# Patient Record
Sex: Female | Born: 1945 | Race: White | Hispanic: No | Smoking: Current every day smoker
Health system: Southern US, Community
[De-identification: ages and names within clinical notes are randomized; demographics above are authoritative.]

## PROBLEM LIST (undated history)

## (undated) DIAGNOSIS — J45909 Unspecified asthma, uncomplicated: Secondary | ICD-10-CM

## (undated) HISTORY — DX: Unspecified asthma, uncomplicated: J45.909

---

## 2011-01-18 ENCOUNTER — Emergency Department (HOSPITAL_COMMUNITY): Payer: No Typology Code available for payment source

## 2011-01-18 ENCOUNTER — Inpatient Hospital Stay (HOSPITAL_COMMUNITY)
Admission: EM | Admit: 2011-01-18 | Discharge: 2011-01-23 | DRG: 087 | Discharge status: Against Medical Advice | Payer: No Typology Code available for payment source | Attending: Emergency Medicine | Admitting: Emergency Medicine

## 2011-01-18 DIAGNOSIS — R4182 Altered mental status, unspecified: Secondary | ICD-10-CM | POA: Diagnosis present

## 2011-01-18 DIAGNOSIS — F1010 Alcohol abuse, uncomplicated: Secondary | ICD-10-CM | POA: Diagnosis present

## 2011-01-18 DIAGNOSIS — S065X0A Traumatic subdural hemorrhage without loss of consciousness, initial encounter: Principal | ICD-10-CM | POA: Diagnosis present

## 2011-01-18 DIAGNOSIS — S0280XA Fracture of other specified skull and facial bones, unspecified side, initial encounter for closed fracture: Secondary | ICD-10-CM | POA: Diagnosis present

## 2011-01-18 DIAGNOSIS — Y998 Other external cause status: Secondary | ICD-10-CM

## 2011-01-18 DIAGNOSIS — S139XXA Sprain of joints and ligaments of unspecified parts of neck, initial encounter: Secondary | ICD-10-CM | POA: Diagnosis present

## 2011-01-18 DIAGNOSIS — F17200 Nicotine dependence, unspecified, uncomplicated: Secondary | ICD-10-CM | POA: Diagnosis present

## 2011-01-18 DIAGNOSIS — H0520 Unspecified exophthalmos: Secondary | ICD-10-CM | POA: Diagnosis present

## 2011-01-18 DIAGNOSIS — S0100XA Unspecified open wound of scalp, initial encounter: Secondary | ICD-10-CM | POA: Diagnosis present

## 2011-01-18 DIAGNOSIS — S0003XA Contusion of scalp, initial encounter: Secondary | ICD-10-CM | POA: Diagnosis present

## 2011-01-18 DIAGNOSIS — Z781 Physical restraint status: Secondary | ICD-10-CM | POA: Diagnosis present

## 2011-01-18 DIAGNOSIS — G9389 Other specified disorders of brain: Secondary | ICD-10-CM | POA: Diagnosis present

## 2011-01-18 LAB — COMPREHENSIVE METABOLIC PANEL
Albumin: 3.7 g/dL (ref 3.5–5.2)
Alkaline Phosphatase: 87 U/L (ref 39–117)
BUN: 16 mg/dL (ref 6–23)
Creatinine, Ser: 0.82 mg/dL (ref 0.4–1.2)
Glucose, Bld: 99 mg/dL (ref 70–99)
Potassium: 3.8 mEq/L (ref 3.5–5.1)
Total Bilirubin: 0.3 mg/dL (ref 0.3–1.2)
Total Protein: 6.4 g/dL (ref 6.0–8.3)

## 2011-01-18 LAB — POCT I-STAT 3, ART BLOOD GAS (G3+)
pCO2 arterial: 38.8 mmHg (ref 35.0–45.0)
pH, Arterial: 7.397 (ref 7.350–7.400)
pO2, Arterial: 107 mmHg — ABNORMAL HIGH (ref 80.0–100.0)

## 2011-01-18 LAB — LACTIC ACID, PLASMA: Lactic Acid, Venous: 2.8 mmol/L — ABNORMAL HIGH (ref 0.5–2.2)

## 2011-01-18 LAB — POCT I-STAT, CHEM 8
Calcium, Ion: 0.98 mmol/L — ABNORMAL LOW (ref 1.12–1.32)
Glucose, Bld: 101 mg/dL — ABNORMAL HIGH (ref 70–99)
HCT: 41 % (ref 36.0–46.0)
Hemoglobin: 13.9 g/dL (ref 12.0–15.0)
TCO2: 24 mmol/L (ref 0–100)

## 2011-01-18 LAB — CBC
HCT: 39 % (ref 36.0–46.0)
MCHC: 34.1 g/dL (ref 30.0–36.0)
Platelets: 236 10*3/uL (ref 150–400)
RDW: 12.2 % (ref 11.5–15.5)
WBC: 10.1 10*3/uL (ref 4.0–10.5)

## 2011-01-18 LAB — URINE MICROSCOPIC-ADD ON

## 2011-01-18 LAB — URINALYSIS, ROUTINE W REFLEX MICROSCOPIC
Bilirubin Urine: NEGATIVE
Glucose, UA: NEGATIVE mg/dL
Ketones, ur: NEGATIVE mg/dL
Protein, ur: NEGATIVE mg/dL
pH: 6 (ref 5.0–8.0)

## 2011-01-18 LAB — TYPE AND SCREEN
ABO/RH(D): A POS
Antibody Screen: NEGATIVE

## 2011-01-18 LAB — PROTIME-INR: INR: 0.87 (ref 0.00–1.49)

## 2011-01-18 MED ORDER — IOHEXOL 300 MG/ML  SOLN
100.0000 mL | Freq: Once | INTRAMUSCULAR | Status: AC | PRN
  Administered 2011-01-18: 100 mL via INTRAVENOUS

## 2011-01-19 ENCOUNTER — Inpatient Hospital Stay (HOSPITAL_COMMUNITY): Payer: No Typology Code available for payment source

## 2011-01-19 LAB — CBC
MCH: 29.8 pg (ref 26.0–34.0)
MCHC: 32.8 g/dL (ref 30.0–36.0)
WBC: 10.7 10*3/uL — ABNORMAL HIGH (ref 4.0–10.5)

## 2011-01-19 LAB — BASIC METABOLIC PANEL
CO2: 27 mEq/L (ref 19–32)
Calcium: 8.2 mg/dL — ABNORMAL LOW (ref 8.4–10.5)
Chloride: 107 mEq/L (ref 96–112)
Creatinine, Ser: 0.73 mg/dL (ref 0.4–1.2)
Glucose, Bld: 102 mg/dL — ABNORMAL HIGH (ref 70–99)

## 2011-01-19 LAB — GLUCOSE, CAPILLARY: Glucose-Capillary: 84 mg/dL (ref 70–99)

## 2011-01-19 LAB — MRSA PCR SCREENING: MRSA by PCR: NEGATIVE

## 2011-01-20 ENCOUNTER — Inpatient Hospital Stay (HOSPITAL_COMMUNITY): Payer: No Typology Code available for payment source

## 2011-01-20 LAB — BASIC METABOLIC PANEL
Calcium: 8.5 mg/dL (ref 8.4–10.5)
Chloride: 110 mEq/L (ref 96–112)
Creatinine, Ser: 0.68 mg/dL (ref 0.4–1.2)
GFR calc Af Amer: >60 mL/min (ref >60)
GFR calc non Af Amer: >60 mL/min (ref >60)

## 2011-01-20 LAB — CBC
MCH: 30.4 pg (ref 26.0–34.0)
MCHC: 33.2 g/dL (ref 30.0–36.0)
Platelets: 170 10*3/uL (ref 150–400)
RBC: 3.88 MIL/uL (ref 3.87–5.11)
RDW: 12.5 % (ref 11.5–15.5)
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
## 2015-06-17 DIAGNOSIS — J449 Chronic obstructive pulmonary disease, unspecified: Secondary | ICD-10-CM | POA: Diagnosis not present

## 2015-06-17 DIAGNOSIS — J4540 Moderate persistent asthma, uncomplicated: Secondary | ICD-10-CM | POA: Diagnosis not present

## 2015-06-17 DIAGNOSIS — J309 Allergic rhinitis, unspecified: Secondary | ICD-10-CM | POA: Diagnosis not present

## 2015-06-21 DIAGNOSIS — Z872 Personal history of diseases of the skin and subcutaneous tissue: Secondary | ICD-10-CM | POA: Insufficient documentation

## 2015-06-21 DIAGNOSIS — Z889 Allergy status to unspecified drugs, medicaments and biological substances status: Secondary | ICD-10-CM | POA: Insufficient documentation

## 2015-06-21 DIAGNOSIS — J309 Allergic rhinitis, unspecified: Secondary | ICD-10-CM | POA: Insufficient documentation

## 2015-06-21 DIAGNOSIS — J45909 Unspecified asthma, uncomplicated: Secondary | ICD-10-CM | POA: Insufficient documentation

## 2015-06-21 DIAGNOSIS — Z7722 Contact with and (suspected) exposure to environmental tobacco smoke (acute) (chronic): Secondary | ICD-10-CM | POA: Insufficient documentation

## 2015-08-20 DIAGNOSIS — O24414 Gestational diabetes mellitus in pregnancy, insulin controlled: Secondary | ICD-10-CM | POA: Diagnosis not present

## 2015-08-20 DIAGNOSIS — Z720 Tobacco use: Secondary | ICD-10-CM | POA: Diagnosis not present

## 2015-08-20 DIAGNOSIS — Z0000 Encounter for general adult medical examination without abnormal findings: Secondary | ICD-10-CM | POA: Diagnosis not present

## 2015-09-23 ENCOUNTER — Ambulatory Visit (INDEPENDENT_AMBULATORY_CARE_PROVIDER_SITE_OTHER): Payer: Medicare Other | Admitting: Allergy and Immunology

## 2015-09-23 ENCOUNTER — Encounter: Payer: Self-pay | Admitting: Allergy and Immunology

## 2015-09-23 VITALS — BP 122/74 | HR 88 | Temp 97.7°F | Resp 16 | Ht 66.93 in | Wt 178.6 lb

## 2015-09-23 DIAGNOSIS — H1010 Acute atopic conjunctivitis, unspecified eye: Secondary | ICD-10-CM | POA: Diagnosis not present

## 2015-09-23 DIAGNOSIS — L501 Idiopathic urticaria: Secondary | ICD-10-CM | POA: Diagnosis not present

## 2015-09-23 DIAGNOSIS — J309 Allergic rhinitis, unspecified: Secondary | ICD-10-CM

## 2015-09-23 DIAGNOSIS — Z91018 Allergy to other foods: Secondary | ICD-10-CM | POA: Diagnosis not present

## 2015-09-23 DIAGNOSIS — J441 Chronic obstructive pulmonary disease with (acute) exacerbation: Secondary | ICD-10-CM | POA: Diagnosis not present

## 2015-09-23 DIAGNOSIS — J45901 Unspecified asthma with (acute) exacerbation: Secondary | ICD-10-CM | POA: Diagnosis not present

## 2015-09-23 DIAGNOSIS — Z720 Tobacco use: Secondary | ICD-10-CM | POA: Diagnosis not present

## 2015-09-23 MED ORDER — CETIRIZINE HCL 10 MG PO TABS: 10.0000 mg | ORAL_TABLET | Freq: Two times a day (BID) | ORAL | Status: DC

## 2015-09-23 MED ORDER — METHYLPREDNISOLONE ACETATE 80 MG/ML IJ SUSP
80.0000 mg | Freq: Once | INTRAMUSCULAR | Status: AC
  Administered 2015-09-23: 80 mg via INTRAMUSCULAR

## 2015-09-23 MED ORDER — IPRATROPIUM BROMIDE 0.02 % IN SOLN
0.5000 mg | Freq: Once | RESPIRATORY_TRACT | Status: AC
  Administered 2015-09-23: 0.5 mg via RESPIRATORY_TRACT

## 2015-09-23 MED ORDER — AZITHROMYCIN 500 MG PO TABS: 500.0000 mg | ORAL_TABLET | Freq: Every day | ORAL | Status: DC

## 2015-09-23 MED ORDER — ALBUTEROL SULFATE (2.5 MG/3ML) 0.083% IN NEBU
2.5000 mg | INHALATION_SOLUTION | Freq: Once | RESPIRATORY_TRACT | Status: AC
  Administered 2015-09-23: 2.5 mg via RESPIRATORY_TRACT

## 2015-09-23 NOTE — Progress Notes (Signed)
[facility]  Follow-up Note  Refering Provider: No ref. provider found Primary Provider: Abigail Miyamoto, MD  Subjective:   [name] is a 69 y.o. female who [facility] in re-evaluation of the following:  HPI Comments:  [name] returns to this clinic on 20 to December 2016 in reevaluation of her obstructive lung disease with component of asthma, recurrent urticaria and allergic reactions and possible food allergy, allergic rhinitis, and a history of rhinitis medicamentosa and tobacco abuse. She states that she is doing fine over the course of the past several months but unfortunately last week she developed wheezing and coughing and sputum production without much upper airway symptoms. She may of had a fever but she has no ugly sputum production and no ugly nasal production. She did develop hives on her face and chest and then globally across her body during this event that she is extremely itchy and is taking Benadryl. It sounds as though she is using her medications appropriately although she's not entirely sure exactly what she is using. It does sound as though she's taking 3 pills which we assume is cetirizine, montelukast, and famotidine area she's also using an inhaler which we assume is Breo 200. She's been using pro-air extensively   Current Outpatient Prescriptions on File Prior to Visit  Medication Sig Dispense Refill  . albuterol (PROAIR HFA) 108 (90 BASE) MCG/ACT inhaler Inhale 2 puffs into the lungs every 4 (four) hours as needed for wheezing or shortness of breath.    . DiphenhydrAMINE HCl (BENADRYL ALLERGY PO) Take by mouth as needed.    Marland Kitchen EPINEPHrine (EPIPEN 2-PAK) 0.3 mg/0.3 mL IJ SOAJ injection Inject 0.3 mg into the muscle once.     No current facility-administered medications on file prior to visit.    Meds ordered this encounter  Medications  . methylPREDNISolone acetate  (DEPO-MEDROL) injection 80 mg    Sig:   . albuterol (PROVENTIL) (2.5 MG/3ML) 0.083% nebulizer solution 2.5 mg    Sig:   . ipratropium (ATROVENT) nebulizer solution 0.5 mg    Sig:   . azithromycin (ZITHROMAX) 500 MG tablet    Sig: Take 1 tablet (500 mg total) by mouth daily.    Dispense:  3 tablet    Refill:  0  . cetirizine (ZYRTEC) 10 MG tablet    Sig: Take 1 tablet (10 mg total) by mouth 2 (two) times daily.    Dispense:  60 tablet    Refill:  3    Past Medical History  Diagnosis Date  . Asthma     History reviewed. No pertinent past surgical history.  Allergies  Allergen Reactions  . Penicillins     Review of Systems  Constitutional: Negative for fever, chills and fatigue.  HENT: Negative for congestion, ear pain, facial swelling, hearing loss, nosebleeds, postnasal drip, rhinorrhea, sinus pressure, sneezing, sore throat, tinnitus, trouble swallowing and voice change.   Eyes: Negative for pain, discharge, redness and itching.  Respiratory: Positive for cough, shortness of breath and wheezing. Negative for chest tightness.   Cardiovascular: Negative for chest pain and leg swelling.  Gastrointestinal: Negative for nausea, vomiting and abdominal pain.  Endocrine: Negative for cold intolerance and heat intolerance.  Musculoskeletal: Negative for myalgias and arthralgias.  Skin: Negative for rash.  Allergic/Immunologic: Negative.   Neurological: Negative for dizziness and headaches.  Hematological: Negative for adenopathy.     Objective:   Filed Vitals:  09/23/15 1006  BP: 122/74  Pulse: 88  Temp: 97.7 F (36.5 C)  Resp: 16   Height: 5' 6.93" (170 cm)  Weight: 178 lb 9.2 oz (81 kg)   Physical Exam  Constitutional: She appears well-developed and well-nourished. No distress.  Coughing  HENT:  Head: Normocephalic and atraumatic. Head is without right periorbital erythema and without left periorbital erythema.  Right Ear: Tympanic membrane, external ear and  ear canal normal. No drainage or tenderness. No foreign bodies. Tympanic membrane is not injected, not scarred, not perforated, not erythematous, not retracted and not bulging. No middle ear effusion.  Left Ear: Tympanic membrane, external ear and ear canal normal. No drainage or tenderness. No foreign bodies. Tympanic membrane is not injected, not scarred, not perforated, not erythematous, not retracted and not bulging.  No middle ear effusion.  Nose: Nose normal. No mucosal edema, rhinorrhea, nose lacerations or sinus tenderness.  No foreign bodies.  Mouth/Throat: Oropharynx is clear and moist. No oropharyngeal exudate, posterior oropharyngeal edema, posterior oropharyngeal erythema or tonsillar abscesses.  Eyes: Lids are normal. Right eye exhibits no chemosis, no discharge and no exudate. No foreign body present in the right eye. Left eye exhibits no chemosis, no discharge and no exudate. No foreign body present in the left eye. Right conjunctiva is not injected. Left conjunctiva is not injected.  Neck: Neck supple. No tracheal tenderness present. No tracheal deviation and no edema present. No thyroid mass and no thyromegaly present.  Cardiovascular: Normal rate, regular rhythm, S1 normal and S2 normal.  Exam reveals no gallop.   No murmur heard. Pulmonary/Chest: No accessory muscle usage or stridor. No respiratory distress. She has wheezes (bilateral expiratory wheezing). She has no rhonchi. She has no rales.  Abdominal: Soft.  Lymphadenopathy:       Head (right side): No tonsillar adenopathy present.       Head (left side): No tonsillar adenopathy present.    She has no cervical adenopathy.  Neurological: She is alert.  Skin: Rash (slight erythema anterior chest) noted. She is not diaphoretic.  Psychiatric: She has a normal mood and affect. Her behavior is normal.    Diagnostics:    Spirometry was performed and demonstrated an FEV1 of 2.04 at 79 % of predicted.  The patient had an Asthma  Control Test with the following results: ACT Total Score: 9.    Assessment and Plan:   1. Acute exacerbation of COPD with asthma (HCC)   2. Allergic rhinoconjunctivitis   3. Idiopathic urticaria   4. History of food allergy   5. Tobacco use      1.  Depo-Medrol 80 IM delivered in clinic  2. Duoneb delivered in clinic  3. Nasal saline several times per day  4. Increase cetirizine 10 mg to two times per day  5. Continue montelukast 10 mg one time per day  6. Continue famotidine 10 mg one tablet one time per day  7. Continue Breo 200 one inhalation 1 time per day  8. Continue OTC Rhinocort one spray each nostril one time per day  9. Continue ProAir HFA 2 puffs every 4-6 hours or albuterol nebulization if needed  10. Continue EpiPen if needed  11. Azithromycin 500 mg one tablet once a day for 3 days  12. Further evaluation?  13. Return to clinic in 3 months or earlier if problem   Jalayla is obviously had a exacerbation of her COPD and asthma and she'll use the therapy described above and we'll make the  assumption she will do well. Certainly she will return to this clinic should she develop a significant problem in the face of this therapy. I did give her broad-spectrum metabolic some systemic steroids in the hope of minimizing this exacerbation. She'll continue to use therapy directed against her upper airway disease and I've encouraged her once again to stop smoking. She does have an EpiPen to use should she ever develop significant reaction in the future.    Laurette Schimke, MD [facility]

## 2015-09-23 NOTE — Patient Instructions (Addendum)
  1.  Depo-Medrol 80 IM delivered in clinic  2. Duoneb delivered in clinic  3. Nasal saline several times per day  4. Increase cetirizine 10 mg to two times per day  5. Continue montelukast 10 mg one time per day  6. Continue famotidine 10 mg one tablet one time per day  7. Continue Breo 200 one inhalation 1 time per day  8. Continue OTC Rhinocort one spray each nostril one time per day  9. Continue ProAir HFA 2 puffs every 4-6 hours or albuterol nebulization if needed  10. Continue EpiPen if needed  11. Azithromycin 500 mg one tablet once a day for 3 days  12. Further evaluation?  13. Return to clinic in 3 months or earlier if problem

## 2015-10-20 DIAGNOSIS — E785 Hyperlipidemia, unspecified: Secondary | ICD-10-CM | POA: Diagnosis not present

## 2015-10-20 DIAGNOSIS — R7303 Prediabetes: Secondary | ICD-10-CM | POA: Diagnosis not present

## 2015-11-17 DIAGNOSIS — H2513 Age-related nuclear cataract, bilateral: Secondary | ICD-10-CM | POA: Diagnosis not present

## 2015-12-02 DIAGNOSIS — H353130 Nonexudative age-related macular degeneration, bilateral, stage unspecified: Secondary | ICD-10-CM | POA: Diagnosis not present

## 2015-12-23 ENCOUNTER — Encounter: Payer: Self-pay | Admitting: Allergy and Immunology

## 2015-12-23 ENCOUNTER — Ambulatory Visit (INDEPENDENT_AMBULATORY_CARE_PROVIDER_SITE_OTHER): Payer: Medicare Other | Admitting: Allergy and Immunology

## 2015-12-23 VITALS — BP 128/72 | HR 72 | Resp 16

## 2015-12-23 DIAGNOSIS — H1010 Acute atopic conjunctivitis, unspecified eye: Secondary | ICD-10-CM

## 2015-12-23 DIAGNOSIS — L501 Idiopathic urticaria: Secondary | ICD-10-CM

## 2015-12-23 DIAGNOSIS — J309 Allergic rhinitis, unspecified: Secondary | ICD-10-CM | POA: Diagnosis not present

## 2015-12-23 DIAGNOSIS — J449 Chronic obstructive pulmonary disease, unspecified: Secondary | ICD-10-CM | POA: Diagnosis not present

## 2015-12-23 DIAGNOSIS — Z91018 Allergy to other foods: Secondary | ICD-10-CM

## 2015-12-23 DIAGNOSIS — J45909 Unspecified asthma, uncomplicated: Secondary | ICD-10-CM | POA: Diagnosis not present

## 2015-12-23 DIAGNOSIS — Z720 Tobacco use: Secondary | ICD-10-CM

## 2015-12-23 NOTE — Patient Instructions (Signed)
    1. Cetirizine 10 mg one times per day  2. Continue montelukast 10 mg one time per day  3. Continue famotidine 10 mg one tablet one time per day  4. Continue OTC Rhinocort one spray each nostril one time per day  5. Continue ProAir HFA 2 puffs every 4-6 hours or albuterol nebulization if needed  6. Continue EpiPen if needed  7. Return to clinic in 6 months or earlier if problem

## 2015-12-23 NOTE — Progress Notes (Signed)
Follow-up Note  Referring Provider: Abigail Miyamoto,* Primary Provider: Abigail Miyamoto, MD Date of Office Visit: 12/23/2015  Subjective:   [name] (DOB: [birth year]) is a 70 y.o. female who [facility] on 12/23/2015 in re-evaluation of the following:  HPI Comments: [name] returns to this clinic in reevaluation of her obstructive lung disease with component of asthma, recurrent urticaria and allergic reactions, possible food allergy, allergic rhinitis, and tobacco use. I've not seen her in his clinic since 09/21/2015. She's had a very good interval. She's had no flareups of her lung issue and has not required any systemic steroids. She uses a bronchodilator 3 times per day. She stopped her Gracelyn Nurse because it irritated her mouth and made her lungs hurt. She is very satisfied with using her short acting bronchodilator 3 times per day and she thinks that this was working very well. She continues to smoke extensively. She is not had any issues with hives while utilizing her cetirizine and famotidine and montelukast. She's had no problems with her nose while intermittently using her Rhinocort.     Medication List       This list is accurate as of: 12/23/15 10:48 AM.  Always use your most recent med list.               BENADRYL ALLERGY PO  Take by mouth as needed.     BREO ELLIPTA 200-25 MCG/INH Aepb  Generic drug:  fluticasone furoate-vilanterol  Inhale 1 puff into the lungs daily. Reported on 12/23/2015     cetirizine 10 MG tablet  Commonly known as:  ZYRTEC  Take 1 tablet (10 mg total) by mouth 2 (two) times daily.     EPIPEN 2-PAK 0.3 mg/0.3 mL Soaj injection  Generic drug:  EPINEPHrine  Inject 0.3 mg into the muscle once.     famotidine 10 MG tablet  Commonly known as:  PEPCID  Take 10 mg by mouth daily.     montelukast 10 MG tablet  Commonly known as:  SINGULAIR  Take 10 mg by mouth daily.     pravastatin 20 MG tablet    Commonly known as:  PRAVACHOL  Take 20 mg by mouth daily.     PROAIR HFA 108 (90 Base) MCG/ACT inhaler  Generic drug:  albuterol  Inhale 2 puffs into the lungs every 4 (four) hours as needed for wheezing or shortness of breath.        Past Medical History  Diagnosis Date  . Asthma     History reviewed. No pertinent past surgical history.  Allergies  Allergen Reactions  . Penicillins     Review of systems negative except as noted in HPI / PMHx or noted below:  Review of Systems  Constitutional: Negative.   HENT: Negative.   Eyes: Negative.   Respiratory: Negative.   Cardiovascular: Negative.   Gastrointestinal: Negative.   Genitourinary: Negative.   Musculoskeletal: Negative.   Skin: Negative.   Neurological: Negative.   Endo/Heme/Allergies: Negative.   Psychiatric/Behavioral: Negative.      Objective:   Filed Vitals:   12/23/15 1029  BP: 128/72  Pulse: 72  Resp: 16          Physical Exam  Constitutional: She is well-developed, well-nourished, and in no distress.  HENT:  Head: Normocephalic.  Right Ear: Tympanic membrane, external ear and ear canal normal.  Left Ear: Tympanic membrane, external ear and ear canal normal.  Nose: Nose normal. No mucosal edema  or rhinorrhea.  Mouth/Throat: Uvula is midline, oropharynx is clear and moist and mucous membranes are normal. No oropharyngeal exudate.  Eyes: Conjunctivae are normal.  Neck: Trachea normal. No tracheal tenderness present. No tracheal deviation present. No thyromegaly present.  Cardiovascular: Normal rate, regular rhythm, S1 normal, S2 normal and normal heart sounds.   No murmur heard. Pulmonary/Chest: No stridor. No respiratory distress. She has wheezes (End expiratory wheezing). She has no rales.  Musculoskeletal: She exhibits no edema.  Lymphadenopathy:       Head (right side): No tonsillar adenopathy present.       Head (left side): No tonsillar adenopathy present.    She has no cervical  adenopathy.    She has no axillary adenopathy.  Neurological: She is alert. Gait normal.  Skin: No rash noted. She is not diaphoretic. No erythema. Nails show no clubbing.  Psychiatric: Mood and affect normal.    Diagnostics:    Spirometry was performed and demonstrated an FEV1 of 2.11 at 81 % of predicted.  The patient had an Asthma Control Test with the following results: ACT Total Score: 20.    Assessment and Plan:   1. COPD with asthma (HCC)   2. Allergic rhinoconjunctivitis   3. Idiopathic urticaria   4. History of food allergy   5. Tobacco use      1. Cetirizine 10 mg one times per day  2. Continue montelukast 10 mg one time per day  3. Continue famotidine 10 mg one tablet one time per day  4. Continue OTC Rhinocort one spray each nostril one time per day  5. Continue ProAir HFA 2 puffs every 4-6 hours or albuterol nebulization if needed  6. Continue EpiPen if needed  7. Return to clinic in 6 months or earlier if problem    [name] is stable with stability being defined as daily wheezing and daily use of a inhalational tobacco product. She does not want to use any other medications at this point time including her Breo. Fortunately, her other medical issues are doing much better and we'll keep her on montelukast and famotidine and cetirizine and Rhinocort and see her back in this clinic in 6 months or earlier if there is a problem. Laurette Schimke, MD  [facility]

## 2016-01-12 DIAGNOSIS — R7303 Prediabetes: Secondary | ICD-10-CM | POA: Diagnosis not present

## 2016-01-12 DIAGNOSIS — R7309 Other abnormal glucose: Secondary | ICD-10-CM | POA: Diagnosis not present

## 2016-01-12 DIAGNOSIS — Z720 Tobacco use: Secondary | ICD-10-CM | POA: Diagnosis not present

## 2016-01-12 DIAGNOSIS — E785 Hyperlipidemia, unspecified: Secondary | ICD-10-CM | POA: Diagnosis not present

## 2016-01-20 ENCOUNTER — Other Ambulatory Visit: Payer: Self-pay | Admitting: *Deleted

## 2016-01-20 MED ORDER — RANITIDINE HCL 150 MG PO TABS: ORAL_TABLET | ORAL | Status: AC

## 2016-05-10 DIAGNOSIS — K219 Gastro-esophageal reflux disease without esophagitis: Secondary | ICD-10-CM | POA: Diagnosis not present

## 2016-05-10 DIAGNOSIS — R1011 Right upper quadrant pain: Secondary | ICD-10-CM | POA: Diagnosis not present

## 2016-05-10 DIAGNOSIS — R7303 Prediabetes: Secondary | ICD-10-CM | POA: Diagnosis not present

## 2016-05-10 DIAGNOSIS — E785 Hyperlipidemia, unspecified: Secondary | ICD-10-CM | POA: Diagnosis not present

## 2016-06-02 DIAGNOSIS — K7689 Other specified diseases of liver: Secondary | ICD-10-CM | POA: Diagnosis not present

## 2016-06-02 DIAGNOSIS — R1011 Right upper quadrant pain: Secondary | ICD-10-CM | POA: Diagnosis not present

## 2016-06-02 DIAGNOSIS — K8020 Calculus of gallbladder without cholecystitis without obstruction: Secondary | ICD-10-CM | POA: Diagnosis not present

## 2016-06-29 ENCOUNTER — Ambulatory Visit: Payer: Medicare Other | Admitting: Allergy and Immunology

## 2016-07-06 DIAGNOSIS — Z23 Encounter for immunization: Secondary | ICD-10-CM | POA: Diagnosis not present

## 2016-07-13 ENCOUNTER — Ambulatory Visit: Payer: Medicare Other | Admitting: Allergy and Immunology

## 2016-07-13 DIAGNOSIS — R1033 Periumbilical pain: Secondary | ICD-10-CM | POA: Diagnosis not present

## 2016-07-25 DIAGNOSIS — K8020 Calculus of gallbladder without cholecystitis without obstruction: Secondary | ICD-10-CM | POA: Diagnosis not present

## 2016-07-25 DIAGNOSIS — R197 Diarrhea, unspecified: Secondary | ICD-10-CM | POA: Diagnosis not present

## 2016-07-25 DIAGNOSIS — R1033 Periumbilical pain: Secondary | ICD-10-CM | POA: Diagnosis not present

## 2016-07-25 DIAGNOSIS — Z1211 Encounter for screening for malignant neoplasm of colon: Secondary | ICD-10-CM | POA: Diagnosis not present

## 2016-07-25 DIAGNOSIS — R160 Hepatomegaly, not elsewhere classified: Secondary | ICD-10-CM | POA: Diagnosis not present

## 2016-12-28 ENCOUNTER — Ambulatory Visit (INDEPENDENT_AMBULATORY_CARE_PROVIDER_SITE_OTHER): Payer: Medicare Other | Admitting: Allergy and Immunology

## 2016-12-28 ENCOUNTER — Telehealth: Payer: Self-pay

## 2016-12-28 ENCOUNTER — Encounter: Payer: Self-pay | Admitting: Allergy and Immunology

## 2016-12-28 VITALS — BP 128/80 | HR 100 | Resp 20

## 2016-12-28 DIAGNOSIS — Z91018 Allergy to other foods: Secondary | ICD-10-CM

## 2016-12-28 DIAGNOSIS — T485X1A Poisoning by other anti-common-cold drugs, accidental (unintentional), initial encounter: Secondary | ICD-10-CM | POA: Diagnosis not present

## 2016-12-28 DIAGNOSIS — J3089 Other allergic rhinitis: Secondary | ICD-10-CM | POA: Diagnosis not present

## 2016-12-28 DIAGNOSIS — L501 Idiopathic urticaria: Secondary | ICD-10-CM

## 2016-12-28 DIAGNOSIS — Z720 Tobacco use: Secondary | ICD-10-CM | POA: Diagnosis not present

## 2016-12-28 DIAGNOSIS — J310 Chronic rhinitis: Secondary | ICD-10-CM

## 2016-12-28 DIAGNOSIS — T485X5A Adverse effect of other anti-common-cold drugs, initial encounter: Secondary | ICD-10-CM

## 2016-12-28 DIAGNOSIS — J449 Chronic obstructive pulmonary disease, unspecified: Secondary | ICD-10-CM | POA: Diagnosis not present

## 2016-12-28 MED ORDER — MONTELUKAST SODIUM 10 MG PO TABS: 10.0000 mg | ORAL_TABLET | Freq: Every day | ORAL | 1 refills | Status: AC

## 2016-12-28 MED ORDER — CETIRIZINE HCL 10 MG PO TABS: 10.0000 mg | ORAL_TABLET | Freq: Every day | ORAL | 1 refills | Status: AC

## 2016-12-28 MED ORDER — ALBUTEROL SULFATE HFA 108 (90 BASE) MCG/ACT IN AERS: INHALATION_SPRAY | RESPIRATORY_TRACT | 1 refills | Status: AC

## 2016-12-28 MED ORDER — EPINEPHRINE 0.3 MG/0.3ML IJ SOAJ: INTRAMUSCULAR | 1 refills | Status: AC

## 2016-12-28 MED ORDER — FAMOTIDINE 20 MG PO TABS: ORAL_TABLET | ORAL | 1 refills | Status: AC

## 2016-12-28 MED ORDER — FAMOTIDINE 10 MG PO TABS: 10.0000 mg | ORAL_TABLET | Freq: Every day | ORAL | 1 refills | Status: DC

## 2016-12-28 NOTE — Patient Instructions (Addendum)
    1. Cetirizine 10 mg one time per day  2. Montelukast 10 mg one time per day  3. Famotidine 10 mg one tablet one time per day  4. OTC Nasacort / Rhinocort one spray each nostril one time per day  5. Continue ProAir HFA 2 puffs every 4-6 hours or albuterol nebulization if needed  6. Continue Breo 200 one inhalation 1 time per day  7. Continue EpiPen if needed  8. STOP BENADREX NASAL INHALER  9. Return to clinic in 6 months or earlier if problem

## 2016-12-28 NOTE — Progress Notes (Signed)
Follow-up Note  Referring Provider: Abigail Miyamoto,* Primary Provider: Abigail Miyamoto, MD Date of Office Visit: 12/28/2016  Subjective:   [name] (DOB: March 26, 1946) is a 71 y.o. female [facility] in re-evaluation of the following:  HPI: Emilya returns to this clinic in reevaluation of her obstructive lung disease with component of asthma, recurrent urticaria, recurrent allergic reactions, allergic rhinitis, and tobacco use. She has not been seen in his clinic in 1 year.  This was a very difficult data collection of as Lashanti was somewhat disorganized and obtuse about directly answering questions and we had a hard time keeping her on target regarding the specific question and answer.  Apparently her lungs have been doing well. It did not sound as though she had required either a systemic steroid or antibiotic to treat a respiratory tract issue in the past year. She uses her short-acting bronchodilator "a lot". He does not really exercise to any large degree as she now uses a cane to ambulate. She continues to smoke tobacco extensively. She may be using Breo but is difficult to tell how many times per week she actually uses this medication.  She has found a nasal inhaler that works wonderful for her. However, she needs to use this inhaler multiple times a day to keep her nose open. It is a Benadrex inhaler. She still has some occasional itchy eyes and still some sneezing. She's not using any other medications for her upper airway atopic disease.  Her urticaria is under pretty good control if she uses a Benadryl on a daily basis. However, if she misses a Benadryl she developed urticaria across her body. She has not developed any associated systemic or constitutional symptoms. She does have an EpiPen.  She is not sure if she received the flu vaccine this year.  Allergies as of 12/28/2016      Reactions   Penicillins  Rash      Medication List      albuterol 108 (90 Base) MCG/ACT inhaler Commonly known as:  PROAIR HFA Inhale two puffs every four to six hours as needed for cough or wheeze.   BENADRYL ALLERGY PO Take by mouth as needed.   BREO ELLIPTA 200-25 MCG/INH Aepb Generic drug:  fluticasone furoate-vilanterol Inhale 1 puff into the lungs daily. Reported on 12/23/2015   cetirizine 10 MG tablet Commonly known as:  ZYRTEC Take 1 tablet (10 mg total) by mouth daily.   EPINEPHrine 0.3 mg/0.3 mL Soaj injection Commonly known as:  EPI-PEN Use as directed for life-threatening allergic reaction.   famotidine 10 MG tablet Commonly known as:  PEPCID Take 1 tablet (10 mg total) by mouth daily.   montelukast 10 MG tablet Commonly known as:  SINGULAIR Take 1 tablet (10 mg total) by mouth daily.   omeprazole 20 MG capsule Commonly known as:  PRILOSEC   pravastatin 20 MG tablet Commonly known as:  PRAVACHOL Take 20 mg by mouth daily.   ranitidine 150 MG tablet Commonly known as:  ZANTAC TAKE ONE TABLET TWICE DAILY AS DIRECTED       Past Medical History:  Diagnosis Date  . Asthma     History reviewed. No pertinent surgical history.  Review of systems negative except as noted in HPI / PMHx or noted below:  Review of Systems  Constitutional: Negative.   HENT: Negative.   Eyes: Negative.   Respiratory: Negative.   Cardiovascular: Negative.   Gastrointestinal: Negative.  Genitourinary: Negative.   Musculoskeletal: Negative.   Skin: Negative.   Neurological: Negative.   Endo/Heme/Allergies: Negative.   Psychiatric/Behavioral: Negative.      Objective:   Vitals:   12/28/16 0832  BP: 128/80  Pulse: 100  Resp: 20          Physical Exam  Constitutional: She is well-developed, well-nourished, and in no distress.  HENT:  Head: Normocephalic.  Right Ear: Tympanic membrane, external ear and ear canal normal.  Left Ear: Tympanic membrane, external ear and ear canal  normal.  Nose: Nose normal. No mucosal edema or rhinorrhea.  Mouth/Throat: Uvula is midline, oropharynx is clear and moist and mucous membranes are normal. No oropharyngeal exudate.  Eyes: Conjunctivae are normal.  Neck: Trachea normal. No tracheal tenderness present. No tracheal deviation present. No thyromegaly present.  Cardiovascular: Normal rate, regular rhythm, S1 normal, S2 normal and normal heart sounds.   No murmur heard. Pulmonary/Chest: Breath sounds normal. No stridor. No respiratory distress. She has no wheezes. She has no rales.  Lymphadenopathy:       Head (right side): No tonsillar adenopathy present.       Head (left side): No tonsillar adenopathy present.    She has no cervical adenopathy.  Neurological: She is alert.  Skin: No rash noted. She is not diaphoretic. No erythema. Nails show no clubbing.  Psychiatric: Mood and affect normal.    Diagnostics:    Spirometry was performed and demonstrated an FEV1 of 1.54 at 60 % of predicted.  Assessment and Plan:   1. COPD with asthma (HCC)   2. Other allergic rhinitis   3. Rhinitis medicamentosa   4. Idiopathic urticaria   5. History of food allergy   6. Tobacco use      1. Cetirizine 10 mg one time per day  2. Montelukast 10 mg one time per day  3. Famotidine 10 mg one tablet one time per day  4. OTC Nasacort / Rhinocort one spray each nostril one time per day  5. Continue ProAir HFA 2 puffs every 4-6 hours or albuterol nebulization if needed  6. Continue Breo 200 one inhalation 1 time per day  7. Continue EpiPen if needed  8. STOP BENADREX NASAL INHALER  9. Return to clinic in 6 months or earlier if problem   I have made some suggestions to [place] and I've given her a written list of suggested medications that she should use to address her respiratory tract inflammation and urticaria as noted above. I also made a strong argument for her to stop her nasal decongestant spray. I have had multiple  discussions with her in the past about stopping smoking and once again this does not appear as though this hobby is going to be discontinued at any point in the near future. I'll see her back in this clinic in 6 months or earlier if there is a problem.  Laurette Schimke, MD Allergy / Immunology [facility]

## 2016-12-28 NOTE — Telephone Encounter (Signed)
Left message for patient to call the office.  Please inform patient that insurance would not cover the Famotidine 10mg  tablet, so we are going to send in Famotidine 20 mg , taking one-half tablet once daily ( same amount of medication). Insurance should cover this Rx. I sent the Rx to [facility] as requested.

## 2017-01-01 ENCOUNTER — Ambulatory Visit: Payer: Medicare Other | Admitting: Allergy and Immunology

## 2017-01-23 DIAGNOSIS — K7460 Unspecified cirrhosis of liver: Secondary | ICD-10-CM | POA: Diagnosis not present

## 2017-01-23 DIAGNOSIS — W19XXXA Unspecified fall, initial encounter: Secondary | ICD-10-CM

## 2017-01-23 DIAGNOSIS — F1020 Alcohol dependence, uncomplicated: Secondary | ICD-10-CM | POA: Diagnosis not present

## 2017-01-23 DIAGNOSIS — N390 Urinary tract infection, site not specified: Secondary | ICD-10-CM

## 2017-01-23 DIAGNOSIS — E876 Hypokalemia: Secondary | ICD-10-CM

## 2017-01-23 DIAGNOSIS — R17 Unspecified jaundice: Secondary | ICD-10-CM

## 2017-01-24 DIAGNOSIS — E876 Hypokalemia: Secondary | ICD-10-CM | POA: Diagnosis not present

## 2017-01-24 DIAGNOSIS — W19XXXA Unspecified fall, initial encounter: Secondary | ICD-10-CM | POA: Diagnosis not present

## 2017-01-24 DIAGNOSIS — R17 Unspecified jaundice: Secondary | ICD-10-CM | POA: Diagnosis not present

## 2017-01-24 DIAGNOSIS — N390 Urinary tract infection, site not specified: Secondary | ICD-10-CM | POA: Diagnosis not present

## 2017-01-25 DIAGNOSIS — N390 Urinary tract infection, site not specified: Secondary | ICD-10-CM | POA: Diagnosis not present

## 2017-01-25 DIAGNOSIS — W19XXXA Unspecified fall, initial encounter: Secondary | ICD-10-CM | POA: Diagnosis not present

## 2017-01-25 DIAGNOSIS — R17 Unspecified jaundice: Secondary | ICD-10-CM | POA: Diagnosis not present

## 2017-01-25 DIAGNOSIS — E876 Hypokalemia: Secondary | ICD-10-CM | POA: Diagnosis not present

## 2017-01-26 DIAGNOSIS — W19XXXA Unspecified fall, initial encounter: Secondary | ICD-10-CM | POA: Diagnosis not present

## 2017-01-26 DIAGNOSIS — E876 Hypokalemia: Secondary | ICD-10-CM | POA: Diagnosis not present

## 2017-01-26 DIAGNOSIS — N390 Urinary tract infection, site not specified: Secondary | ICD-10-CM | POA: Diagnosis not present

## 2017-01-26 DIAGNOSIS — R17 Unspecified jaundice: Secondary | ICD-10-CM | POA: Diagnosis not present

## 2017-01-30 DIAGNOSIS — W19XXXD Unspecified fall, subsequent encounter: Secondary | ICD-10-CM | POA: Diagnosis not present

## 2017-01-30 DIAGNOSIS — R2681 Unsteadiness on feet: Secondary | ICD-10-CM | POA: Diagnosis not present

## 2017-01-30 DIAGNOSIS — K7460 Unspecified cirrhosis of liver: Secondary | ICD-10-CM | POA: Diagnosis not present

## 2017-01-30 DIAGNOSIS — D508 Other iron deficiency anemias: Secondary | ICD-10-CM | POA: Diagnosis not present

## 2017-01-30 DIAGNOSIS — J45909 Unspecified asthma, uncomplicated: Secondary | ICD-10-CM | POA: Diagnosis not present

## 2017-01-30 DIAGNOSIS — Z9981 Dependence on supplemental oxygen: Secondary | ICD-10-CM | POA: Diagnosis not present

## 2017-01-30 DIAGNOSIS — R918 Other nonspecific abnormal finding of lung field: Secondary | ICD-10-CM | POA: Diagnosis not present

## 2017-01-30 DIAGNOSIS — R319 Hematuria, unspecified: Secondary | ICD-10-CM | POA: Diagnosis not present

## 2017-01-30 DIAGNOSIS — Z87440 Personal history of urinary (tract) infections: Secondary | ICD-10-CM | POA: Diagnosis not present

## 2017-01-30 DIAGNOSIS — D509 Iron deficiency anemia, unspecified: Secondary | ICD-10-CM | POA: Diagnosis not present

## 2017-01-30 DIAGNOSIS — K8020 Calculus of gallbladder without cholecystitis without obstruction: Secondary | ICD-10-CM | POA: Diagnosis not present

## 2017-01-30 DIAGNOSIS — E876 Hypokalemia: Secondary | ICD-10-CM | POA: Diagnosis not present

## 2017-01-30 DIAGNOSIS — K921 Melena: Secondary | ICD-10-CM | POA: Diagnosis not present

## 2017-01-30 DIAGNOSIS — Z741 Need for assistance with personal care: Secondary | ICD-10-CM | POA: Diagnosis not present

## 2017-01-30 DIAGNOSIS — D72829 Elevated white blood cell count, unspecified: Secondary | ICD-10-CM | POA: Diagnosis not present

## 2017-01-30 DIAGNOSIS — D649 Anemia, unspecified: Secondary | ICD-10-CM | POA: Diagnosis not present

## 2017-01-30 DIAGNOSIS — R262 Difficulty in walking, not elsewhere classified: Secondary | ICD-10-CM | POA: Diagnosis not present

## 2017-01-30 DIAGNOSIS — N390 Urinary tract infection, site not specified: Secondary | ICD-10-CM | POA: Diagnosis not present

## 2017-01-30 DIAGNOSIS — K219 Gastro-esophageal reflux disease without esophagitis: Secondary | ICD-10-CM | POA: Diagnosis not present

## 2017-01-30 DIAGNOSIS — R110 Nausea: Secondary | ICD-10-CM | POA: Diagnosis not present

## 2017-01-30 DIAGNOSIS — I10 Essential (primary) hypertension: Secondary | ICD-10-CM | POA: Diagnosis not present

## 2017-01-30 DIAGNOSIS — R188 Other ascites: Secondary | ICD-10-CM | POA: Diagnosis not present

## 2017-01-30 DIAGNOSIS — N3000 Acute cystitis without hematuria: Secondary | ICD-10-CM | POA: Diagnosis not present

## 2017-01-30 DIAGNOSIS — I4891 Unspecified atrial fibrillation: Secondary | ICD-10-CM | POA: Diagnosis not present

## 2017-01-30 DIAGNOSIS — E43 Unspecified severe protein-calorie malnutrition: Secondary | ICD-10-CM | POA: Diagnosis not present

## 2017-01-30 DIAGNOSIS — R2689 Other abnormalities of gait and mobility: Secondary | ICD-10-CM | POA: Diagnosis not present

## 2017-01-30 DIAGNOSIS — R17 Unspecified jaundice: Secondary | ICD-10-CM | POA: Diagnosis not present

## 2017-01-30 DIAGNOSIS — M6281 Muscle weakness (generalized): Secondary | ICD-10-CM | POA: Diagnosis not present

## 2017-01-30 DIAGNOSIS — Z9181 History of falling: Secondary | ICD-10-CM | POA: Diagnosis not present

## 2017-01-30 DIAGNOSIS — Z8711 Personal history of peptic ulcer disease: Secondary | ICD-10-CM | POA: Diagnosis not present

## 2017-01-30 DIAGNOSIS — R296 Repeated falls: Secondary | ICD-10-CM | POA: Diagnosis not present

## 2017-01-31 DIAGNOSIS — R296 Repeated falls: Secondary | ICD-10-CM | POA: Diagnosis not present

## 2017-01-31 DIAGNOSIS — E876 Hypokalemia: Secondary | ICD-10-CM | POA: Diagnosis not present

## 2017-01-31 DIAGNOSIS — R110 Nausea: Secondary | ICD-10-CM | POA: Diagnosis not present

## 2017-01-31 DIAGNOSIS — N390 Urinary tract infection, site not specified: Secondary | ICD-10-CM | POA: Diagnosis not present

## 2017-02-07 DIAGNOSIS — R17 Unspecified jaundice: Secondary | ICD-10-CM | POA: Diagnosis not present

## 2017-02-07 DIAGNOSIS — R110 Nausea: Secondary | ICD-10-CM | POA: Diagnosis not present

## 2017-02-07 DIAGNOSIS — R296 Repeated falls: Secondary | ICD-10-CM | POA: Diagnosis not present

## 2017-02-14 DIAGNOSIS — R188 Other ascites: Secondary | ICD-10-CM | POA: Diagnosis not present

## 2017-02-14 DIAGNOSIS — R17 Unspecified jaundice: Secondary | ICD-10-CM | POA: Diagnosis not present

## 2017-02-15 DIAGNOSIS — R188 Other ascites: Secondary | ICD-10-CM | POA: Diagnosis not present

## 2017-02-15 DIAGNOSIS — K7460 Unspecified cirrhosis of liver: Secondary | ICD-10-CM | POA: Diagnosis not present

## 2017-02-21 DIAGNOSIS — D508 Other iron deficiency anemias: Secondary | ICD-10-CM | POA: Diagnosis not present

## 2017-02-21 DIAGNOSIS — D72829 Elevated white blood cell count, unspecified: Secondary | ICD-10-CM | POA: Diagnosis not present

## 2017-02-21 DIAGNOSIS — R17 Unspecified jaundice: Secondary | ICD-10-CM | POA: Diagnosis not present

## 2017-02-21 DIAGNOSIS — K921 Melena: Secondary | ICD-10-CM | POA: Diagnosis not present

## 2017-02-22 DIAGNOSIS — R188 Other ascites: Secondary | ICD-10-CM | POA: Diagnosis not present

## 2017-02-22 DIAGNOSIS — K8020 Calculus of gallbladder without cholecystitis without obstruction: Secondary | ICD-10-CM | POA: Diagnosis not present

## 2017-02-23 DIAGNOSIS — E876 Hypokalemia: Secondary | ICD-10-CM | POA: Diagnosis not present

## 2017-02-23 DIAGNOSIS — D72829 Elevated white blood cell count, unspecified: Secondary | ICD-10-CM | POA: Diagnosis not present

## 2017-02-24 DIAGNOSIS — Z9181 History of falling: Secondary | ICD-10-CM | POA: Diagnosis not present

## 2017-02-24 DIAGNOSIS — K219 Gastro-esophageal reflux disease without esophagitis: Secondary | ICD-10-CM | POA: Diagnosis not present

## 2017-02-24 DIAGNOSIS — R2689 Other abnormalities of gait and mobility: Secondary | ICD-10-CM | POA: Diagnosis not present

## 2017-02-24 DIAGNOSIS — J45909 Unspecified asthma, uncomplicated: Secondary | ICD-10-CM | POA: Diagnosis not present

## 2017-02-24 DIAGNOSIS — Z87440 Personal history of urinary (tract) infections: Secondary | ICD-10-CM | POA: Diagnosis not present

## 2017-02-24 DIAGNOSIS — Z8711 Personal history of peptic ulcer disease: Secondary | ICD-10-CM | POA: Diagnosis not present

## 2017-02-24 DIAGNOSIS — Z9981 Dependence on supplemental oxygen: Secondary | ICD-10-CM | POA: Diagnosis not present

## 2017-02-24 DIAGNOSIS — D509 Iron deficiency anemia, unspecified: Secondary | ICD-10-CM | POA: Diagnosis not present

## 2017-02-27 DIAGNOSIS — Z9981 Dependence on supplemental oxygen: Secondary | ICD-10-CM | POA: Diagnosis not present

## 2017-02-27 DIAGNOSIS — Z87440 Personal history of urinary (tract) infections: Secondary | ICD-10-CM | POA: Diagnosis not present

## 2017-02-27 DIAGNOSIS — J45909 Unspecified asthma, uncomplicated: Secondary | ICD-10-CM | POA: Diagnosis not present

## 2017-02-27 DIAGNOSIS — K219 Gastro-esophageal reflux disease without esophagitis: Secondary | ICD-10-CM | POA: Diagnosis not present

## 2017-02-27 DIAGNOSIS — Z9181 History of falling: Secondary | ICD-10-CM | POA: Diagnosis not present

## 2017-02-27 DIAGNOSIS — Z8711 Personal history of peptic ulcer disease: Secondary | ICD-10-CM | POA: Diagnosis not present

## 2017-02-27 DIAGNOSIS — D509 Iron deficiency anemia, unspecified: Secondary | ICD-10-CM | POA: Diagnosis not present

## 2017-02-27 DIAGNOSIS — R2689 Other abnormalities of gait and mobility: Secondary | ICD-10-CM | POA: Diagnosis not present

## 2017-02-28 DIAGNOSIS — R2681 Unsteadiness on feet: Secondary | ICD-10-CM | POA: Diagnosis not present

## 2017-02-28 DIAGNOSIS — I10 Essential (primary) hypertension: Secondary | ICD-10-CM | POA: Diagnosis not present

## 2017-02-28 DIAGNOSIS — Z9181 History of falling: Secondary | ICD-10-CM | POA: Diagnosis not present

## 2017-02-28 DIAGNOSIS — Z741 Need for assistance with personal care: Secondary | ICD-10-CM | POA: Diagnosis not present

## 2017-02-28 DIAGNOSIS — R069 Unspecified abnormalities of breathing: Secondary | ICD-10-CM | POA: Diagnosis not present

## 2017-03-01 DIAGNOSIS — Z9981 Dependence on supplemental oxygen: Secondary | ICD-10-CM | POA: Diagnosis not present

## 2017-03-01 DIAGNOSIS — D509 Iron deficiency anemia, unspecified: Secondary | ICD-10-CM | POA: Diagnosis not present

## 2017-03-01 DIAGNOSIS — R2689 Other abnormalities of gait and mobility: Secondary | ICD-10-CM | POA: Diagnosis not present

## 2017-03-01 DIAGNOSIS — K219 Gastro-esophageal reflux disease without esophagitis: Secondary | ICD-10-CM | POA: Diagnosis not present

## 2017-03-01 DIAGNOSIS — Z8711 Personal history of peptic ulcer disease: Secondary | ICD-10-CM | POA: Diagnosis not present

## 2017-03-01 DIAGNOSIS — J45909 Unspecified asthma, uncomplicated: Secondary | ICD-10-CM | POA: Diagnosis not present

## 2017-03-01 DIAGNOSIS — Z87440 Personal history of urinary (tract) infections: Secondary | ICD-10-CM | POA: Diagnosis not present

## 2017-03-01 DIAGNOSIS — Z9181 History of falling: Secondary | ICD-10-CM | POA: Diagnosis not present

## 2017-03-02 DIAGNOSIS — R2689 Other abnormalities of gait and mobility: Secondary | ICD-10-CM | POA: Diagnosis not present

## 2017-03-02 DIAGNOSIS — W19XXXD Unspecified fall, subsequent encounter: Secondary | ICD-10-CM | POA: Diagnosis not present

## 2017-03-02 DIAGNOSIS — Z8711 Personal history of peptic ulcer disease: Secondary | ICD-10-CM | POA: Diagnosis not present

## 2017-03-02 DIAGNOSIS — K4031 Unilateral inguinal hernia, with obstruction, without gangrene, recurrent: Secondary | ICD-10-CM | POA: Diagnosis not present

## 2017-03-02 DIAGNOSIS — Z87440 Personal history of urinary (tract) infections: Secondary | ICD-10-CM | POA: Diagnosis not present

## 2017-03-02 DIAGNOSIS — K219 Gastro-esophageal reflux disease without esophagitis: Secondary | ICD-10-CM | POA: Diagnosis not present

## 2017-03-02 DIAGNOSIS — D509 Iron deficiency anemia, unspecified: Secondary | ICD-10-CM | POA: Diagnosis not present

## 2017-03-02 DIAGNOSIS — J45909 Unspecified asthma, uncomplicated: Secondary | ICD-10-CM | POA: Diagnosis not present

## 2017-03-02 DIAGNOSIS — N3000 Acute cystitis without hematuria: Secondary | ICD-10-CM | POA: Diagnosis not present

## 2017-03-02 DIAGNOSIS — Z9981 Dependence on supplemental oxygen: Secondary | ICD-10-CM | POA: Diagnosis not present

## 2017-03-02 DIAGNOSIS — Z9181 History of falling: Secondary | ICD-10-CM | POA: Diagnosis not present

## 2017-03-05 DIAGNOSIS — Z9181 History of falling: Secondary | ICD-10-CM | POA: Diagnosis not present

## 2017-03-05 DIAGNOSIS — J45909 Unspecified asthma, uncomplicated: Secondary | ICD-10-CM | POA: Diagnosis not present

## 2017-03-05 DIAGNOSIS — R2689 Other abnormalities of gait and mobility: Secondary | ICD-10-CM | POA: Diagnosis not present

## 2017-03-05 DIAGNOSIS — Z8711 Personal history of peptic ulcer disease: Secondary | ICD-10-CM | POA: Diagnosis not present

## 2017-03-05 DIAGNOSIS — Z87440 Personal history of urinary (tract) infections: Secondary | ICD-10-CM | POA: Diagnosis not present

## 2017-03-05 DIAGNOSIS — Z9981 Dependence on supplemental oxygen: Secondary | ICD-10-CM | POA: Diagnosis not present

## 2017-03-05 DIAGNOSIS — K219 Gastro-esophageal reflux disease without esophagitis: Secondary | ICD-10-CM | POA: Diagnosis not present

## 2017-03-05 DIAGNOSIS — D509 Iron deficiency anemia, unspecified: Secondary | ICD-10-CM | POA: Diagnosis not present

## 2017-03-06 DIAGNOSIS — Z87440 Personal history of urinary (tract) infections: Secondary | ICD-10-CM | POA: Diagnosis not present

## 2017-03-06 DIAGNOSIS — Z9981 Dependence on supplemental oxygen: Secondary | ICD-10-CM | POA: Diagnosis not present

## 2017-03-06 DIAGNOSIS — D509 Iron deficiency anemia, unspecified: Secondary | ICD-10-CM | POA: Diagnosis not present

## 2017-03-06 DIAGNOSIS — Z9181 History of falling: Secondary | ICD-10-CM | POA: Diagnosis not present

## 2017-03-06 DIAGNOSIS — J45909 Unspecified asthma, uncomplicated: Secondary | ICD-10-CM | POA: Diagnosis not present

## 2017-03-06 DIAGNOSIS — Z8711 Personal history of peptic ulcer disease: Secondary | ICD-10-CM | POA: Diagnosis not present

## 2017-03-06 DIAGNOSIS — K219 Gastro-esophageal reflux disease without esophagitis: Secondary | ICD-10-CM | POA: Diagnosis not present

## 2017-03-06 DIAGNOSIS — R2689 Other abnormalities of gait and mobility: Secondary | ICD-10-CM | POA: Diagnosis not present

## 2017-03-07 DIAGNOSIS — Z9181 History of falling: Secondary | ICD-10-CM | POA: Diagnosis not present

## 2017-03-07 DIAGNOSIS — K219 Gastro-esophageal reflux disease without esophagitis: Secondary | ICD-10-CM | POA: Diagnosis not present

## 2017-03-07 DIAGNOSIS — D509 Iron deficiency anemia, unspecified: Secondary | ICD-10-CM | POA: Diagnosis not present

## 2017-03-07 DIAGNOSIS — R2689 Other abnormalities of gait and mobility: Secondary | ICD-10-CM | POA: Diagnosis not present

## 2017-03-07 DIAGNOSIS — J45909 Unspecified asthma, uncomplicated: Secondary | ICD-10-CM | POA: Diagnosis not present

## 2017-03-07 DIAGNOSIS — Z9981 Dependence on supplemental oxygen: Secondary | ICD-10-CM | POA: Diagnosis not present

## 2017-03-07 DIAGNOSIS — Z8711 Personal history of peptic ulcer disease: Secondary | ICD-10-CM | POA: Diagnosis not present

## 2017-03-07 DIAGNOSIS — Z87440 Personal history of urinary (tract) infections: Secondary | ICD-10-CM | POA: Diagnosis not present

## 2017-03-08 DIAGNOSIS — Z87440 Personal history of urinary (tract) infections: Secondary | ICD-10-CM | POA: Diagnosis not present

## 2017-03-08 DIAGNOSIS — R2689 Other abnormalities of gait and mobility: Secondary | ICD-10-CM | POA: Diagnosis not present

## 2017-03-08 DIAGNOSIS — Z8711 Personal history of peptic ulcer disease: Secondary | ICD-10-CM | POA: Diagnosis not present

## 2017-03-08 DIAGNOSIS — D509 Iron deficiency anemia, unspecified: Secondary | ICD-10-CM | POA: Diagnosis not present

## 2017-03-08 DIAGNOSIS — J45909 Unspecified asthma, uncomplicated: Secondary | ICD-10-CM | POA: Diagnosis not present

## 2017-03-08 DIAGNOSIS — K219 Gastro-esophageal reflux disease without esophagitis: Secondary | ICD-10-CM | POA: Diagnosis not present

## 2017-03-08 DIAGNOSIS — Z9981 Dependence on supplemental oxygen: Secondary | ICD-10-CM | POA: Diagnosis not present

## 2017-03-08 DIAGNOSIS — Z9181 History of falling: Secondary | ICD-10-CM | POA: Diagnosis not present

## 2017-03-09 DIAGNOSIS — Z87440 Personal history of urinary (tract) infections: Secondary | ICD-10-CM | POA: Diagnosis not present

## 2017-03-09 DIAGNOSIS — D509 Iron deficiency anemia, unspecified: Secondary | ICD-10-CM | POA: Diagnosis not present

## 2017-03-09 DIAGNOSIS — Z9981 Dependence on supplemental oxygen: Secondary | ICD-10-CM | POA: Diagnosis not present

## 2017-03-09 DIAGNOSIS — Z8711 Personal history of peptic ulcer disease: Secondary | ICD-10-CM | POA: Diagnosis not present

## 2017-03-09 DIAGNOSIS — K219 Gastro-esophageal reflux disease without esophagitis: Secondary | ICD-10-CM | POA: Diagnosis not present

## 2017-03-09 DIAGNOSIS — R2689 Other abnormalities of gait and mobility: Secondary | ICD-10-CM | POA: Diagnosis not present

## 2017-03-09 DIAGNOSIS — Z9181 History of falling: Secondary | ICD-10-CM | POA: Diagnosis not present

## 2017-03-09 DIAGNOSIS — J45909 Unspecified asthma, uncomplicated: Secondary | ICD-10-CM | POA: Diagnosis not present

## 2017-03-12 DIAGNOSIS — Z8711 Personal history of peptic ulcer disease: Secondary | ICD-10-CM | POA: Diagnosis not present

## 2017-03-12 DIAGNOSIS — D509 Iron deficiency anemia, unspecified: Secondary | ICD-10-CM | POA: Diagnosis not present

## 2017-03-12 DIAGNOSIS — K219 Gastro-esophageal reflux disease without esophagitis: Secondary | ICD-10-CM | POA: Diagnosis not present

## 2017-03-12 DIAGNOSIS — J45909 Unspecified asthma, uncomplicated: Secondary | ICD-10-CM | POA: Diagnosis not present

## 2017-03-12 DIAGNOSIS — Z9181 History of falling: Secondary | ICD-10-CM | POA: Diagnosis not present

## 2017-03-12 DIAGNOSIS — R2689 Other abnormalities of gait and mobility: Secondary | ICD-10-CM | POA: Diagnosis not present

## 2017-03-12 DIAGNOSIS — Z9981 Dependence on supplemental oxygen: Secondary | ICD-10-CM | POA: Diagnosis not present

## 2017-03-12 DIAGNOSIS — Z87440 Personal history of urinary (tract) infections: Secondary | ICD-10-CM | POA: Diagnosis not present

## 2017-03-13 DIAGNOSIS — Z87440 Personal history of urinary (tract) infections: Secondary | ICD-10-CM | POA: Diagnosis not present

## 2017-03-13 DIAGNOSIS — Z8711 Personal history of peptic ulcer disease: Secondary | ICD-10-CM | POA: Diagnosis not present

## 2017-03-13 DIAGNOSIS — Z9181 History of falling: Secondary | ICD-10-CM | POA: Diagnosis not present

## 2017-03-13 DIAGNOSIS — J45909 Unspecified asthma, uncomplicated: Secondary | ICD-10-CM | POA: Diagnosis not present

## 2017-03-13 DIAGNOSIS — Z9981 Dependence on supplemental oxygen: Secondary | ICD-10-CM | POA: Diagnosis not present

## 2017-03-13 DIAGNOSIS — R2689 Other abnormalities of gait and mobility: Secondary | ICD-10-CM | POA: Diagnosis not present

## 2017-03-13 DIAGNOSIS — D509 Iron deficiency anemia, unspecified: Secondary | ICD-10-CM | POA: Diagnosis not present

## 2017-03-13 DIAGNOSIS — K219 Gastro-esophageal reflux disease without esophagitis: Secondary | ICD-10-CM | POA: Diagnosis not present

## 2017-03-14 DIAGNOSIS — Z9981 Dependence on supplemental oxygen: Secondary | ICD-10-CM | POA: Diagnosis not present

## 2017-03-14 DIAGNOSIS — K219 Gastro-esophageal reflux disease without esophagitis: Secondary | ICD-10-CM | POA: Diagnosis not present

## 2017-03-14 DIAGNOSIS — Z8711 Personal history of peptic ulcer disease: Secondary | ICD-10-CM | POA: Diagnosis not present

## 2017-03-14 DIAGNOSIS — Z87440 Personal history of urinary (tract) infections: Secondary | ICD-10-CM | POA: Diagnosis not present

## 2017-03-14 DIAGNOSIS — R2689 Other abnormalities of gait and mobility: Secondary | ICD-10-CM | POA: Diagnosis not present

## 2017-03-14 DIAGNOSIS — J45909 Unspecified asthma, uncomplicated: Secondary | ICD-10-CM | POA: Diagnosis not present

## 2017-03-14 DIAGNOSIS — D509 Iron deficiency anemia, unspecified: Secondary | ICD-10-CM | POA: Diagnosis not present

## 2017-03-14 DIAGNOSIS — Z9181 History of falling: Secondary | ICD-10-CM | POA: Diagnosis not present

## 2017-03-16 DIAGNOSIS — K219 Gastro-esophageal reflux disease without esophagitis: Secondary | ICD-10-CM | POA: Diagnosis not present

## 2017-03-16 DIAGNOSIS — J45909 Unspecified asthma, uncomplicated: Secondary | ICD-10-CM | POA: Diagnosis not present

## 2017-03-16 DIAGNOSIS — W19XXXD Unspecified fall, subsequent encounter: Secondary | ICD-10-CM | POA: Diagnosis not present

## 2017-03-16 DIAGNOSIS — N3000 Acute cystitis without hematuria: Secondary | ICD-10-CM | POA: Diagnosis not present

## 2017-04-23 DIAGNOSIS — K219 Gastro-esophageal reflux disease without esophagitis: Secondary | ICD-10-CM | POA: Diagnosis not present

## 2017-04-23 DIAGNOSIS — J45909 Unspecified asthma, uncomplicated: Secondary | ICD-10-CM | POA: Diagnosis not present

## 2017-04-23 DIAGNOSIS — K5900 Constipation, unspecified: Secondary | ICD-10-CM | POA: Diagnosis not present

## 2017-04-23 DIAGNOSIS — D649 Anemia, unspecified: Secondary | ICD-10-CM | POA: Diagnosis not present

## 2017-04-23 DIAGNOSIS — R2681 Unsteadiness on feet: Secondary | ICD-10-CM | POA: Diagnosis not present

## 2017-04-23 DIAGNOSIS — R188 Other ascites: Secondary | ICD-10-CM | POA: Diagnosis not present

## 2017-04-23 DIAGNOSIS — I10 Essential (primary) hypertension: Secondary | ICD-10-CM | POA: Diagnosis not present

## 2017-05-03 DIAGNOSIS — R188 Other ascites: Secondary | ICD-10-CM | POA: Diagnosis not present

## 2017-05-16 DIAGNOSIS — R188 Other ascites: Secondary | ICD-10-CM | POA: Diagnosis not present

## 2017-06-25 ENCOUNTER — Ambulatory Visit: Payer: Medicare Other | Admitting: Allergy and Immunology

## 2017-06-25 DIAGNOSIS — J309 Allergic rhinitis, unspecified: Secondary | ICD-10-CM

## 2017-09-17 DIAGNOSIS — K7290 Hepatic failure, unspecified without coma: Secondary | ICD-10-CM | POA: Diagnosis not present

## 2017-09-17 DIAGNOSIS — R188 Other ascites: Secondary | ICD-10-CM | POA: Diagnosis not present

## 2017-09-17 DIAGNOSIS — Z4803 Encounter for change or removal of drains: Secondary | ICD-10-CM | POA: Diagnosis not present

## 2017-10-13 DIAGNOSIS — J45909 Unspecified asthma, uncomplicated: Secondary | ICD-10-CM | POA: Diagnosis not present

## 2017-10-13 DIAGNOSIS — D509 Iron deficiency anemia, unspecified: Secondary | ICD-10-CM | POA: Diagnosis not present

## 2017-10-13 DIAGNOSIS — Z87440 Personal history of urinary (tract) infections: Secondary | ICD-10-CM | POA: Diagnosis not present

## 2017-10-13 DIAGNOSIS — K219 Gastro-esophageal reflux disease without esophagitis: Secondary | ICD-10-CM | POA: Diagnosis not present

## 2017-10-13 DIAGNOSIS — Z9181 History of falling: Secondary | ICD-10-CM | POA: Diagnosis not present

## 2017-10-13 DIAGNOSIS — Z8711 Personal history of peptic ulcer disease: Secondary | ICD-10-CM | POA: Diagnosis not present

## 2017-10-15 DIAGNOSIS — D509 Iron deficiency anemia, unspecified: Secondary | ICD-10-CM | POA: Diagnosis not present

## 2017-10-15 DIAGNOSIS — K219 Gastro-esophageal reflux disease without esophagitis: Secondary | ICD-10-CM | POA: Diagnosis not present

## 2017-10-15 DIAGNOSIS — Z87440 Personal history of urinary (tract) infections: Secondary | ICD-10-CM | POA: Diagnosis not present

## 2017-10-15 DIAGNOSIS — J45909 Unspecified asthma, uncomplicated: Secondary | ICD-10-CM | POA: Diagnosis not present

## 2017-10-15 DIAGNOSIS — Z9181 History of falling: Secondary | ICD-10-CM | POA: Diagnosis not present

## 2017-10-15 DIAGNOSIS — Z8711 Personal history of peptic ulcer disease: Secondary | ICD-10-CM | POA: Diagnosis not present

## 2017-10-16 DIAGNOSIS — Z87440 Personal history of urinary (tract) infections: Secondary | ICD-10-CM | POA: Diagnosis not present

## 2017-10-16 DIAGNOSIS — J45909 Unspecified asthma, uncomplicated: Secondary | ICD-10-CM | POA: Diagnosis not present

## 2017-10-16 DIAGNOSIS — D509 Iron deficiency anemia, unspecified: Secondary | ICD-10-CM | POA: Diagnosis not present

## 2017-10-16 DIAGNOSIS — Z9181 History of falling: Secondary | ICD-10-CM | POA: Diagnosis not present

## 2017-10-16 DIAGNOSIS — Z8711 Personal history of peptic ulcer disease: Secondary | ICD-10-CM | POA: Diagnosis not present

## 2017-10-16 DIAGNOSIS — K219 Gastro-esophageal reflux disease without esophagitis: Secondary | ICD-10-CM | POA: Diagnosis not present

## 2017-10-17 DIAGNOSIS — Z87440 Personal history of urinary (tract) infections: Secondary | ICD-10-CM | POA: Diagnosis not present

## 2017-10-17 DIAGNOSIS — Z9181 History of falling: Secondary | ICD-10-CM | POA: Diagnosis not present

## 2017-10-17 DIAGNOSIS — K219 Gastro-esophageal reflux disease without esophagitis: Secondary | ICD-10-CM | POA: Diagnosis not present

## 2017-10-17 DIAGNOSIS — D509 Iron deficiency anemia, unspecified: Secondary | ICD-10-CM | POA: Diagnosis not present

## 2017-10-17 DIAGNOSIS — J45909 Unspecified asthma, uncomplicated: Secondary | ICD-10-CM | POA: Diagnosis not present

## 2017-10-17 DIAGNOSIS — Z8711 Personal history of peptic ulcer disease: Secondary | ICD-10-CM | POA: Diagnosis not present

## 2017-10-18 DIAGNOSIS — Z87440 Personal history of urinary (tract) infections: Secondary | ICD-10-CM | POA: Diagnosis not present

## 2017-10-18 DIAGNOSIS — K219 Gastro-esophageal reflux disease without esophagitis: Secondary | ICD-10-CM | POA: Diagnosis not present

## 2017-10-18 DIAGNOSIS — Z8711 Personal history of peptic ulcer disease: Secondary | ICD-10-CM | POA: Diagnosis not present

## 2017-10-18 DIAGNOSIS — Z9181 History of falling: Secondary | ICD-10-CM | POA: Diagnosis not present

## 2017-10-18 DIAGNOSIS — J45909 Unspecified asthma, uncomplicated: Secondary | ICD-10-CM | POA: Diagnosis not present

## 2017-10-18 DIAGNOSIS — D509 Iron deficiency anemia, unspecified: Secondary | ICD-10-CM | POA: Diagnosis not present

## 2017-10-19 DIAGNOSIS — Z8711 Personal history of peptic ulcer disease: Secondary | ICD-10-CM | POA: Diagnosis not present

## 2017-10-19 DIAGNOSIS — Z9181 History of falling: Secondary | ICD-10-CM | POA: Diagnosis not present

## 2017-10-19 DIAGNOSIS — Z87440 Personal history of urinary (tract) infections: Secondary | ICD-10-CM | POA: Diagnosis not present

## 2017-10-19 DIAGNOSIS — J45909 Unspecified asthma, uncomplicated: Secondary | ICD-10-CM | POA: Diagnosis not present

## 2017-10-19 DIAGNOSIS — K219 Gastro-esophageal reflux disease without esophagitis: Secondary | ICD-10-CM | POA: Diagnosis not present

## 2017-10-19 DIAGNOSIS — D509 Iron deficiency anemia, unspecified: Secondary | ICD-10-CM | POA: Diagnosis not present

## 2017-10-22 DIAGNOSIS — Z9181 History of falling: Secondary | ICD-10-CM | POA: Diagnosis not present

## 2017-10-22 DIAGNOSIS — J45909 Unspecified asthma, uncomplicated: Secondary | ICD-10-CM | POA: Diagnosis not present

## 2017-10-22 DIAGNOSIS — Z8711 Personal history of peptic ulcer disease: Secondary | ICD-10-CM | POA: Diagnosis not present

## 2017-10-22 DIAGNOSIS — Z87440 Personal history of urinary (tract) infections: Secondary | ICD-10-CM | POA: Diagnosis not present

## 2017-10-22 DIAGNOSIS — D509 Iron deficiency anemia, unspecified: Secondary | ICD-10-CM | POA: Diagnosis not present

## 2017-10-22 DIAGNOSIS — K219 Gastro-esophageal reflux disease without esophagitis: Secondary | ICD-10-CM | POA: Diagnosis not present

## 2017-10-23 DIAGNOSIS — K219 Gastro-esophageal reflux disease without esophagitis: Secondary | ICD-10-CM | POA: Diagnosis not present

## 2017-10-23 DIAGNOSIS — J45909 Unspecified asthma, uncomplicated: Secondary | ICD-10-CM | POA: Diagnosis not present

## 2017-10-23 DIAGNOSIS — D509 Iron deficiency anemia, unspecified: Secondary | ICD-10-CM | POA: Diagnosis not present

## 2017-10-23 DIAGNOSIS — Z9181 History of falling: Secondary | ICD-10-CM | POA: Diagnosis not present

## 2017-10-23 DIAGNOSIS — Z87440 Personal history of urinary (tract) infections: Secondary | ICD-10-CM | POA: Diagnosis not present

## 2017-10-23 DIAGNOSIS — Z8711 Personal history of peptic ulcer disease: Secondary | ICD-10-CM | POA: Diagnosis not present

## 2017-10-25 DIAGNOSIS — Z8711 Personal history of peptic ulcer disease: Secondary | ICD-10-CM | POA: Diagnosis not present

## 2017-10-25 DIAGNOSIS — Z87440 Personal history of urinary (tract) infections: Secondary | ICD-10-CM | POA: Diagnosis not present

## 2017-10-25 DIAGNOSIS — Z9181 History of falling: Secondary | ICD-10-CM | POA: Diagnosis not present

## 2017-10-25 DIAGNOSIS — K219 Gastro-esophageal reflux disease without esophagitis: Secondary | ICD-10-CM | POA: Diagnosis not present

## 2017-10-25 DIAGNOSIS — J45909 Unspecified asthma, uncomplicated: Secondary | ICD-10-CM | POA: Diagnosis not present

## 2017-10-25 DIAGNOSIS — D509 Iron deficiency anemia, unspecified: Secondary | ICD-10-CM | POA: Diagnosis not present

## 2017-10-29 DIAGNOSIS — D509 Iron deficiency anemia, unspecified: Secondary | ICD-10-CM | POA: Diagnosis not present

## 2017-10-29 DIAGNOSIS — K219 Gastro-esophageal reflux disease without esophagitis: Secondary | ICD-10-CM | POA: Diagnosis not present

## 2017-10-29 DIAGNOSIS — J45909 Unspecified asthma, uncomplicated: Secondary | ICD-10-CM | POA: Diagnosis not present

## 2017-10-29 DIAGNOSIS — Z8711 Personal history of peptic ulcer disease: Secondary | ICD-10-CM | POA: Diagnosis not present

## 2017-10-29 DIAGNOSIS — Z9181 History of falling: Secondary | ICD-10-CM | POA: Diagnosis not present

## 2017-10-29 DIAGNOSIS — Z87440 Personal history of urinary (tract) infections: Secondary | ICD-10-CM | POA: Diagnosis not present

## 2017-10-30 DIAGNOSIS — R7301 Impaired fasting glucose: Secondary | ICD-10-CM | POA: Diagnosis not present

## 2017-10-30 DIAGNOSIS — K219 Gastro-esophageal reflux disease without esophagitis: Secondary | ICD-10-CM | POA: Diagnosis not present

## 2017-10-30 DIAGNOSIS — E785 Hyperlipidemia, unspecified: Secondary | ICD-10-CM | POA: Diagnosis not present

## 2017-10-31 DIAGNOSIS — Z9181 History of falling: Secondary | ICD-10-CM | POA: Diagnosis not present

## 2017-10-31 DIAGNOSIS — K219 Gastro-esophageal reflux disease without esophagitis: Secondary | ICD-10-CM | POA: Diagnosis not present

## 2017-10-31 DIAGNOSIS — J45909 Unspecified asthma, uncomplicated: Secondary | ICD-10-CM | POA: Diagnosis not present

## 2017-10-31 DIAGNOSIS — Z87440 Personal history of urinary (tract) infections: Secondary | ICD-10-CM | POA: Diagnosis not present

## 2017-10-31 DIAGNOSIS — Z8711 Personal history of peptic ulcer disease: Secondary | ICD-10-CM | POA: Diagnosis not present

## 2017-10-31 DIAGNOSIS — D509 Iron deficiency anemia, unspecified: Secondary | ICD-10-CM | POA: Diagnosis not present

## 2017-11-01 DIAGNOSIS — Z87440 Personal history of urinary (tract) infections: Secondary | ICD-10-CM | POA: Diagnosis not present

## 2017-11-01 DIAGNOSIS — D509 Iron deficiency anemia, unspecified: Secondary | ICD-10-CM | POA: Diagnosis not present

## 2017-11-01 DIAGNOSIS — Z8711 Personal history of peptic ulcer disease: Secondary | ICD-10-CM | POA: Diagnosis not present

## 2017-11-01 DIAGNOSIS — K219 Gastro-esophageal reflux disease without esophagitis: Secondary | ICD-10-CM | POA: Diagnosis not present

## 2017-11-01 DIAGNOSIS — J45909 Unspecified asthma, uncomplicated: Secondary | ICD-10-CM | POA: Diagnosis not present

## 2017-11-01 DIAGNOSIS — Z9181 History of falling: Secondary | ICD-10-CM | POA: Diagnosis not present

## 2017-11-06 DIAGNOSIS — K219 Gastro-esophageal reflux disease without esophagitis: Secondary | ICD-10-CM | POA: Diagnosis not present

## 2017-11-06 DIAGNOSIS — J45909 Unspecified asthma, uncomplicated: Secondary | ICD-10-CM | POA: Diagnosis not present

## 2017-11-06 DIAGNOSIS — D509 Iron deficiency anemia, unspecified: Secondary | ICD-10-CM | POA: Diagnosis not present

## 2017-11-10 DIAGNOSIS — L089 Local infection of the skin and subcutaneous tissue, unspecified: Secondary | ICD-10-CM | POA: Diagnosis not present

## 2017-11-10 DIAGNOSIS — E785 Hyperlipidemia, unspecified: Secondary | ICD-10-CM | POA: Diagnosis not present

## 2017-11-10 DIAGNOSIS — J029 Acute pharyngitis, unspecified: Secondary | ICD-10-CM | POA: Diagnosis not present

## 2017-11-10 DIAGNOSIS — R070 Pain in throat: Secondary | ICD-10-CM | POA: Diagnosis not present

## 2017-11-13 DIAGNOSIS — I714 Abdominal aortic aneurysm, without rupture: Secondary | ICD-10-CM | POA: Diagnosis not present

## 2017-11-13 DIAGNOSIS — J45909 Unspecified asthma, uncomplicated: Secondary | ICD-10-CM | POA: Diagnosis not present

## 2017-11-13 DIAGNOSIS — I10 Essential (primary) hypertension: Secondary | ICD-10-CM | POA: Diagnosis not present

## 2017-11-13 DIAGNOSIS — Z590 Homelessness: Secondary | ICD-10-CM | POA: Diagnosis not present

## 2017-11-13 DIAGNOSIS — K219 Gastro-esophageal reflux disease without esophagitis: Secondary | ICD-10-CM | POA: Diagnosis not present

## 2017-11-13 DIAGNOSIS — Z79899 Other long term (current) drug therapy: Secondary | ICD-10-CM | POA: Diagnosis not present

## 2017-11-13 DIAGNOSIS — N390 Urinary tract infection, site not specified: Secondary | ICD-10-CM | POA: Diagnosis not present

## 2017-11-13 DIAGNOSIS — Z751 Person awaiting admission to adequate facility elsewhere: Secondary | ICD-10-CM | POA: Diagnosis not present

## 2017-11-14 DIAGNOSIS — Z79899 Other long term (current) drug therapy: Secondary | ICD-10-CM | POA: Diagnosis not present

## 2017-11-14 DIAGNOSIS — Z751 Person awaiting admission to adequate facility elsewhere: Secondary | ICD-10-CM | POA: Diagnosis not present

## 2017-11-14 DIAGNOSIS — I10 Essential (primary) hypertension: Secondary | ICD-10-CM | POA: Diagnosis not present

## 2017-11-14 DIAGNOSIS — J45909 Unspecified asthma, uncomplicated: Secondary | ICD-10-CM | POA: Diagnosis not present

## 2017-11-14 DIAGNOSIS — I714 Abdominal aortic aneurysm, without rupture: Secondary | ICD-10-CM | POA: Diagnosis not present

## 2017-11-14 DIAGNOSIS — N390 Urinary tract infection, site not specified: Secondary | ICD-10-CM | POA: Diagnosis not present

## 2017-11-14 DIAGNOSIS — Z590 Homelessness: Secondary | ICD-10-CM | POA: Diagnosis not present

## 2017-11-14 DIAGNOSIS — K219 Gastro-esophageal reflux disease without esophagitis: Secondary | ICD-10-CM | POA: Diagnosis not present

## 2017-11-15 DIAGNOSIS — I10 Essential (primary) hypertension: Secondary | ICD-10-CM | POA: Diagnosis not present

## 2017-11-15 DIAGNOSIS — J45909 Unspecified asthma, uncomplicated: Secondary | ICD-10-CM | POA: Diagnosis not present

## 2017-11-15 DIAGNOSIS — K7689 Other specified diseases of liver: Secondary | ICD-10-CM | POA: Diagnosis not present

## 2017-11-15 DIAGNOSIS — I714 Abdominal aortic aneurysm, without rupture: Secondary | ICD-10-CM | POA: Diagnosis not present

## 2017-11-15 DIAGNOSIS — K219 Gastro-esophageal reflux disease without esophagitis: Secondary | ICD-10-CM | POA: Diagnosis not present

## 2017-11-15 DIAGNOSIS — Z590 Homelessness: Secondary | ICD-10-CM | POA: Diagnosis not present

## 2017-11-15 DIAGNOSIS — Z751 Person awaiting admission to adequate facility elsewhere: Secondary | ICD-10-CM | POA: Diagnosis not present

## 2017-11-15 DIAGNOSIS — N390 Urinary tract infection, site not specified: Secondary | ICD-10-CM | POA: Diagnosis not present

## 2017-11-15 DIAGNOSIS — Z79899 Other long term (current) drug therapy: Secondary | ICD-10-CM | POA: Diagnosis not present

## 2017-11-16 DIAGNOSIS — N390 Urinary tract infection, site not specified: Secondary | ICD-10-CM | POA: Diagnosis not present

## 2017-11-16 DIAGNOSIS — I714 Abdominal aortic aneurysm, without rupture: Secondary | ICD-10-CM | POA: Diagnosis not present

## 2017-11-16 DIAGNOSIS — Z590 Homelessness: Secondary | ICD-10-CM | POA: Diagnosis not present

## 2017-11-16 DIAGNOSIS — J45909 Unspecified asthma, uncomplicated: Secondary | ICD-10-CM | POA: Diagnosis not present

## 2017-11-16 DIAGNOSIS — Z79899 Other long term (current) drug therapy: Secondary | ICD-10-CM | POA: Diagnosis not present

## 2017-11-16 DIAGNOSIS — K219 Gastro-esophageal reflux disease without esophagitis: Secondary | ICD-10-CM | POA: Diagnosis not present

## 2017-11-16 DIAGNOSIS — I10 Essential (primary) hypertension: Secondary | ICD-10-CM | POA: Diagnosis not present

## 2017-11-16 DIAGNOSIS — Z751 Person awaiting admission to adequate facility elsewhere: Secondary | ICD-10-CM | POA: Diagnosis not present

## 2017-11-17 DIAGNOSIS — J45909 Unspecified asthma, uncomplicated: Secondary | ICD-10-CM | POA: Diagnosis not present

## 2017-11-17 DIAGNOSIS — Z751 Person awaiting admission to adequate facility elsewhere: Secondary | ICD-10-CM | POA: Diagnosis not present

## 2017-11-17 DIAGNOSIS — Z590 Homelessness: Secondary | ICD-10-CM | POA: Diagnosis not present

## 2017-11-17 DIAGNOSIS — I714 Abdominal aortic aneurysm, without rupture: Secondary | ICD-10-CM | POA: Diagnosis not present

## 2017-11-17 DIAGNOSIS — Z79899 Other long term (current) drug therapy: Secondary | ICD-10-CM | POA: Diagnosis not present

## 2017-11-17 DIAGNOSIS — I10 Essential (primary) hypertension: Secondary | ICD-10-CM | POA: Diagnosis not present

## 2017-11-17 DIAGNOSIS — N390 Urinary tract infection, site not specified: Secondary | ICD-10-CM | POA: Diagnosis not present

## 2017-11-17 DIAGNOSIS — K219 Gastro-esophageal reflux disease without esophagitis: Secondary | ICD-10-CM | POA: Diagnosis not present

## 2017-11-18 DIAGNOSIS — Z590 Homelessness: Secondary | ICD-10-CM | POA: Diagnosis not present

## 2017-11-18 DIAGNOSIS — Z751 Person awaiting admission to adequate facility elsewhere: Secondary | ICD-10-CM | POA: Diagnosis not present

## 2017-11-18 DIAGNOSIS — N390 Urinary tract infection, site not specified: Secondary | ICD-10-CM | POA: Diagnosis not present

## 2017-11-18 DIAGNOSIS — J45909 Unspecified asthma, uncomplicated: Secondary | ICD-10-CM | POA: Diagnosis not present

## 2017-11-18 DIAGNOSIS — I10 Essential (primary) hypertension: Secondary | ICD-10-CM | POA: Diagnosis not present

## 2017-11-18 DIAGNOSIS — Z79899 Other long term (current) drug therapy: Secondary | ICD-10-CM | POA: Diagnosis not present

## 2017-11-18 DIAGNOSIS — K219 Gastro-esophageal reflux disease without esophagitis: Secondary | ICD-10-CM | POA: Diagnosis not present

## 2017-11-18 DIAGNOSIS — I714 Abdominal aortic aneurysm, without rupture: Secondary | ICD-10-CM | POA: Diagnosis not present

## 2017-11-19 DIAGNOSIS — I10 Essential (primary) hypertension: Secondary | ICD-10-CM | POA: Diagnosis not present

## 2017-11-19 DIAGNOSIS — Z590 Homelessness: Secondary | ICD-10-CM | POA: Diagnosis not present

## 2017-11-19 DIAGNOSIS — K219 Gastro-esophageal reflux disease without esophagitis: Secondary | ICD-10-CM | POA: Diagnosis not present

## 2017-11-19 DIAGNOSIS — N390 Urinary tract infection, site not specified: Secondary | ICD-10-CM | POA: Diagnosis not present

## 2017-11-19 DIAGNOSIS — Z79899 Other long term (current) drug therapy: Secondary | ICD-10-CM | POA: Diagnosis not present

## 2017-11-19 DIAGNOSIS — I714 Abdominal aortic aneurysm, without rupture: Secondary | ICD-10-CM | POA: Diagnosis not present

## 2017-11-19 DIAGNOSIS — Z751 Person awaiting admission to adequate facility elsewhere: Secondary | ICD-10-CM | POA: Diagnosis not present

## 2017-11-19 DIAGNOSIS — J45909 Unspecified asthma, uncomplicated: Secondary | ICD-10-CM | POA: Diagnosis not present

## 2017-11-20 DIAGNOSIS — K219 Gastro-esophageal reflux disease without esophagitis: Secondary | ICD-10-CM | POA: Diagnosis not present

## 2017-11-20 DIAGNOSIS — J45909 Unspecified asthma, uncomplicated: Secondary | ICD-10-CM | POA: Diagnosis not present

## 2017-11-20 DIAGNOSIS — I10 Essential (primary) hypertension: Secondary | ICD-10-CM | POA: Diagnosis not present

## 2017-11-20 DIAGNOSIS — N390 Urinary tract infection, site not specified: Secondary | ICD-10-CM | POA: Diagnosis not present

## 2017-11-20 DIAGNOSIS — Z79899 Other long term (current) drug therapy: Secondary | ICD-10-CM | POA: Diagnosis not present

## 2017-11-20 DIAGNOSIS — Z751 Person awaiting admission to adequate facility elsewhere: Secondary | ICD-10-CM | POA: Diagnosis not present

## 2017-11-20 DIAGNOSIS — Z590 Homelessness: Secondary | ICD-10-CM | POA: Diagnosis not present

## 2017-11-20 DIAGNOSIS — I714 Abdominal aortic aneurysm, without rupture: Secondary | ICD-10-CM | POA: Diagnosis not present

## 2017-11-21 DIAGNOSIS — J45909 Unspecified asthma, uncomplicated: Secondary | ICD-10-CM | POA: Diagnosis not present

## 2017-11-21 DIAGNOSIS — Z79899 Other long term (current) drug therapy: Secondary | ICD-10-CM | POA: Diagnosis not present

## 2017-11-21 DIAGNOSIS — I10 Essential (primary) hypertension: Secondary | ICD-10-CM | POA: Diagnosis not present

## 2017-11-21 DIAGNOSIS — Z751 Person awaiting admission to adequate facility elsewhere: Secondary | ICD-10-CM | POA: Diagnosis not present

## 2017-11-21 DIAGNOSIS — K219 Gastro-esophageal reflux disease without esophagitis: Secondary | ICD-10-CM | POA: Diagnosis not present

## 2017-11-21 DIAGNOSIS — I714 Abdominal aortic aneurysm, without rupture: Secondary | ICD-10-CM | POA: Diagnosis not present

## 2017-11-21 DIAGNOSIS — N390 Urinary tract infection, site not specified: Secondary | ICD-10-CM | POA: Diagnosis not present

## 2017-11-21 DIAGNOSIS — Z590 Homelessness: Secondary | ICD-10-CM | POA: Diagnosis not present

## 2017-11-22 DIAGNOSIS — Z751 Person awaiting admission to adequate facility elsewhere: Secondary | ICD-10-CM | POA: Diagnosis not present

## 2017-11-22 DIAGNOSIS — I10 Essential (primary) hypertension: Secondary | ICD-10-CM | POA: Diagnosis not present

## 2017-11-22 DIAGNOSIS — Z590 Homelessness: Secondary | ICD-10-CM | POA: Diagnosis not present

## 2017-11-22 DIAGNOSIS — Z79899 Other long term (current) drug therapy: Secondary | ICD-10-CM | POA: Diagnosis not present

## 2017-11-22 DIAGNOSIS — J45909 Unspecified asthma, uncomplicated: Secondary | ICD-10-CM | POA: Diagnosis not present

## 2017-11-22 DIAGNOSIS — I714 Abdominal aortic aneurysm, without rupture: Secondary | ICD-10-CM | POA: Diagnosis not present

## 2017-11-22 DIAGNOSIS — N390 Urinary tract infection, site not specified: Secondary | ICD-10-CM | POA: Diagnosis not present

## 2017-11-22 DIAGNOSIS — K219 Gastro-esophageal reflux disease without esophagitis: Secondary | ICD-10-CM | POA: Diagnosis not present

## 2017-11-23 DIAGNOSIS — I10 Essential (primary) hypertension: Secondary | ICD-10-CM | POA: Diagnosis not present

## 2017-11-23 DIAGNOSIS — K219 Gastro-esophageal reflux disease without esophagitis: Secondary | ICD-10-CM | POA: Diagnosis not present

## 2017-11-23 DIAGNOSIS — Z590 Homelessness: Secondary | ICD-10-CM | POA: Diagnosis not present

## 2017-11-23 DIAGNOSIS — J45909 Unspecified asthma, uncomplicated: Secondary | ICD-10-CM | POA: Diagnosis not present

## 2017-11-23 DIAGNOSIS — I714 Abdominal aortic aneurysm, without rupture: Secondary | ICD-10-CM | POA: Diagnosis not present

## 2017-11-23 DIAGNOSIS — Z79899 Other long term (current) drug therapy: Secondary | ICD-10-CM | POA: Diagnosis not present

## 2017-11-23 DIAGNOSIS — Z751 Person awaiting admission to adequate facility elsewhere: Secondary | ICD-10-CM | POA: Diagnosis not present

## 2017-11-23 DIAGNOSIS — N390 Urinary tract infection, site not specified: Secondary | ICD-10-CM | POA: Diagnosis not present

## 2017-11-24 DIAGNOSIS — Z751 Person awaiting admission to adequate facility elsewhere: Secondary | ICD-10-CM | POA: Diagnosis not present

## 2017-11-24 DIAGNOSIS — J45909 Unspecified asthma, uncomplicated: Secondary | ICD-10-CM | POA: Diagnosis not present

## 2017-11-24 DIAGNOSIS — N390 Urinary tract infection, site not specified: Secondary | ICD-10-CM | POA: Diagnosis not present

## 2017-11-24 DIAGNOSIS — I10 Essential (primary) hypertension: Secondary | ICD-10-CM | POA: Diagnosis not present

## 2017-11-24 DIAGNOSIS — K219 Gastro-esophageal reflux disease without esophagitis: Secondary | ICD-10-CM | POA: Diagnosis not present

## 2017-11-24 DIAGNOSIS — I714 Abdominal aortic aneurysm, without rupture: Secondary | ICD-10-CM | POA: Diagnosis not present

## 2017-11-24 DIAGNOSIS — Z79899 Other long term (current) drug therapy: Secondary | ICD-10-CM | POA: Diagnosis not present

## 2017-11-24 DIAGNOSIS — Z590 Homelessness: Secondary | ICD-10-CM | POA: Diagnosis not present

## 2017-11-25 DIAGNOSIS — Z590 Homelessness: Secondary | ICD-10-CM | POA: Diagnosis not present

## 2017-11-25 DIAGNOSIS — I714 Abdominal aortic aneurysm, without rupture: Secondary | ICD-10-CM | POA: Diagnosis not present

## 2017-11-25 DIAGNOSIS — K219 Gastro-esophageal reflux disease without esophagitis: Secondary | ICD-10-CM | POA: Diagnosis not present

## 2017-11-25 DIAGNOSIS — Z751 Person awaiting admission to adequate facility elsewhere: Secondary | ICD-10-CM | POA: Diagnosis not present

## 2017-11-25 DIAGNOSIS — Z79899 Other long term (current) drug therapy: Secondary | ICD-10-CM | POA: Diagnosis not present

## 2017-11-25 DIAGNOSIS — J45909 Unspecified asthma, uncomplicated: Secondary | ICD-10-CM | POA: Diagnosis not present

## 2017-11-25 DIAGNOSIS — I10 Essential (primary) hypertension: Secondary | ICD-10-CM | POA: Diagnosis not present

## 2017-11-25 DIAGNOSIS — N390 Urinary tract infection, site not specified: Secondary | ICD-10-CM | POA: Diagnosis not present

## 2017-11-26 DIAGNOSIS — J45909 Unspecified asthma, uncomplicated: Secondary | ICD-10-CM | POA: Diagnosis not present

## 2017-11-26 DIAGNOSIS — Z751 Person awaiting admission to adequate facility elsewhere: Secondary | ICD-10-CM | POA: Diagnosis not present

## 2017-11-26 DIAGNOSIS — Z79899 Other long term (current) drug therapy: Secondary | ICD-10-CM | POA: Diagnosis not present

## 2017-11-26 DIAGNOSIS — N390 Urinary tract infection, site not specified: Secondary | ICD-10-CM | POA: Diagnosis not present

## 2017-11-26 DIAGNOSIS — K219 Gastro-esophageal reflux disease without esophagitis: Secondary | ICD-10-CM | POA: Diagnosis not present

## 2017-11-26 DIAGNOSIS — Z590 Homelessness: Secondary | ICD-10-CM | POA: Diagnosis not present

## 2017-11-26 DIAGNOSIS — I714 Abdominal aortic aneurysm, without rupture: Secondary | ICD-10-CM | POA: Diagnosis not present

## 2017-11-26 DIAGNOSIS — I10 Essential (primary) hypertension: Secondary | ICD-10-CM | POA: Diagnosis not present

## 2017-11-27 DIAGNOSIS — Z751 Person awaiting admission to adequate facility elsewhere: Secondary | ICD-10-CM | POA: Diagnosis not present

## 2017-11-27 DIAGNOSIS — K219 Gastro-esophageal reflux disease without esophagitis: Secondary | ICD-10-CM | POA: Diagnosis not present

## 2017-11-27 DIAGNOSIS — I10 Essential (primary) hypertension: Secondary | ICD-10-CM | POA: Diagnosis not present

## 2017-11-27 DIAGNOSIS — N390 Urinary tract infection, site not specified: Secondary | ICD-10-CM | POA: Diagnosis not present

## 2017-11-27 DIAGNOSIS — I714 Abdominal aortic aneurysm, without rupture: Secondary | ICD-10-CM | POA: Diagnosis not present

## 2017-11-27 DIAGNOSIS — J45909 Unspecified asthma, uncomplicated: Secondary | ICD-10-CM | POA: Diagnosis not present

## 2017-11-27 DIAGNOSIS — Z79899 Other long term (current) drug therapy: Secondary | ICD-10-CM | POA: Diagnosis not present

## 2017-11-27 DIAGNOSIS — Z590 Homelessness: Secondary | ICD-10-CM | POA: Diagnosis not present

## 2017-11-28 DIAGNOSIS — I10 Essential (primary) hypertension: Secondary | ICD-10-CM | POA: Diagnosis not present

## 2017-11-28 DIAGNOSIS — J45909 Unspecified asthma, uncomplicated: Secondary | ICD-10-CM | POA: Diagnosis not present

## 2017-11-28 DIAGNOSIS — K219 Gastro-esophageal reflux disease without esophagitis: Secondary | ICD-10-CM | POA: Diagnosis not present

## 2017-11-28 DIAGNOSIS — Z751 Person awaiting admission to adequate facility elsewhere: Secondary | ICD-10-CM | POA: Diagnosis not present

## 2017-11-28 DIAGNOSIS — N390 Urinary tract infection, site not specified: Secondary | ICD-10-CM | POA: Diagnosis not present

## 2017-11-28 DIAGNOSIS — I714 Abdominal aortic aneurysm, without rupture: Secondary | ICD-10-CM | POA: Diagnosis not present

## 2017-11-28 DIAGNOSIS — Z590 Homelessness: Secondary | ICD-10-CM | POA: Diagnosis not present

## 2017-11-28 DIAGNOSIS — Z79899 Other long term (current) drug therapy: Secondary | ICD-10-CM | POA: Diagnosis not present

## 2017-11-29 DIAGNOSIS — K219 Gastro-esophageal reflux disease without esophagitis: Secondary | ICD-10-CM | POA: Diagnosis not present

## 2017-11-29 DIAGNOSIS — Z79899 Other long term (current) drug therapy: Secondary | ICD-10-CM | POA: Diagnosis not present

## 2017-11-29 DIAGNOSIS — Z751 Person awaiting admission to adequate facility elsewhere: Secondary | ICD-10-CM | POA: Diagnosis not present

## 2017-11-29 DIAGNOSIS — N390 Urinary tract infection, site not specified: Secondary | ICD-10-CM | POA: Diagnosis not present

## 2017-11-29 DIAGNOSIS — I10 Essential (primary) hypertension: Secondary | ICD-10-CM | POA: Diagnosis not present

## 2017-11-29 DIAGNOSIS — I714 Abdominal aortic aneurysm, without rupture: Secondary | ICD-10-CM | POA: Diagnosis not present

## 2017-11-29 DIAGNOSIS — Z590 Homelessness: Secondary | ICD-10-CM | POA: Diagnosis not present

## 2017-11-29 DIAGNOSIS — J45909 Unspecified asthma, uncomplicated: Secondary | ICD-10-CM | POA: Diagnosis not present

## 2017-11-30 DIAGNOSIS — I10 Essential (primary) hypertension: Secondary | ICD-10-CM | POA: Diagnosis not present

## 2017-11-30 DIAGNOSIS — K219 Gastro-esophageal reflux disease without esophagitis: Secondary | ICD-10-CM | POA: Diagnosis not present

## 2017-11-30 DIAGNOSIS — N390 Urinary tract infection, site not specified: Secondary | ICD-10-CM | POA: Diagnosis not present

## 2017-11-30 DIAGNOSIS — I714 Abdominal aortic aneurysm, without rupture: Secondary | ICD-10-CM | POA: Diagnosis not present

## 2017-11-30 DIAGNOSIS — Z751 Person awaiting admission to adequate facility elsewhere: Secondary | ICD-10-CM | POA: Diagnosis not present

## 2017-11-30 DIAGNOSIS — J45909 Unspecified asthma, uncomplicated: Secondary | ICD-10-CM | POA: Diagnosis not present

## 2017-11-30 DIAGNOSIS — Z79899 Other long term (current) drug therapy: Secondary | ICD-10-CM | POA: Diagnosis not present

## 2017-11-30 DIAGNOSIS — Z590 Homelessness: Secondary | ICD-10-CM | POA: Diagnosis not present

## 2017-12-01 DIAGNOSIS — Z79899 Other long term (current) drug therapy: Secondary | ICD-10-CM | POA: Diagnosis not present

## 2017-12-01 DIAGNOSIS — K219 Gastro-esophageal reflux disease without esophagitis: Secondary | ICD-10-CM | POA: Diagnosis not present

## 2017-12-01 DIAGNOSIS — N390 Urinary tract infection, site not specified: Secondary | ICD-10-CM | POA: Diagnosis not present

## 2017-12-01 DIAGNOSIS — Z590 Homelessness: Secondary | ICD-10-CM | POA: Diagnosis not present

## 2017-12-01 DIAGNOSIS — Z751 Person awaiting admission to adequate facility elsewhere: Secondary | ICD-10-CM | POA: Diagnosis not present

## 2017-12-01 DIAGNOSIS — I10 Essential (primary) hypertension: Secondary | ICD-10-CM | POA: Diagnosis not present

## 2017-12-01 DIAGNOSIS — J45909 Unspecified asthma, uncomplicated: Secondary | ICD-10-CM | POA: Diagnosis not present

## 2017-12-01 DIAGNOSIS — I714 Abdominal aortic aneurysm, without rupture: Secondary | ICD-10-CM | POA: Diagnosis not present

## 2017-12-02 DIAGNOSIS — K219 Gastro-esophageal reflux disease without esophagitis: Secondary | ICD-10-CM | POA: Diagnosis not present

## 2017-12-02 DIAGNOSIS — I10 Essential (primary) hypertension: Secondary | ICD-10-CM | POA: Diagnosis not present

## 2017-12-02 DIAGNOSIS — Z590 Homelessness: Secondary | ICD-10-CM | POA: Diagnosis not present

## 2017-12-02 DIAGNOSIS — Z751 Person awaiting admission to adequate facility elsewhere: Secondary | ICD-10-CM | POA: Diagnosis not present

## 2017-12-02 DIAGNOSIS — N390 Urinary tract infection, site not specified: Secondary | ICD-10-CM | POA: Diagnosis not present

## 2017-12-02 DIAGNOSIS — J45909 Unspecified asthma, uncomplicated: Secondary | ICD-10-CM | POA: Diagnosis not present

## 2017-12-02 DIAGNOSIS — I714 Abdominal aortic aneurysm, without rupture: Secondary | ICD-10-CM | POA: Diagnosis not present

## 2017-12-02 DIAGNOSIS — Z79899 Other long term (current) drug therapy: Secondary | ICD-10-CM | POA: Diagnosis not present

## 2017-12-03 DIAGNOSIS — Z590 Homelessness: Secondary | ICD-10-CM | POA: Diagnosis not present

## 2017-12-03 DIAGNOSIS — J45909 Unspecified asthma, uncomplicated: Secondary | ICD-10-CM | POA: Diagnosis not present

## 2017-12-03 DIAGNOSIS — I714 Abdominal aortic aneurysm, without rupture: Secondary | ICD-10-CM | POA: Diagnosis not present

## 2017-12-03 DIAGNOSIS — Z79899 Other long term (current) drug therapy: Secondary | ICD-10-CM | POA: Diagnosis not present

## 2017-12-03 DIAGNOSIS — K219 Gastro-esophageal reflux disease without esophagitis: Secondary | ICD-10-CM | POA: Diagnosis not present

## 2017-12-03 DIAGNOSIS — I10 Essential (primary) hypertension: Secondary | ICD-10-CM | POA: Diagnosis not present

## 2017-12-03 DIAGNOSIS — Z751 Person awaiting admission to adequate facility elsewhere: Secondary | ICD-10-CM | POA: Diagnosis not present

## 2017-12-03 DIAGNOSIS — N390 Urinary tract infection, site not specified: Secondary | ICD-10-CM | POA: Diagnosis not present

## 2017-12-04 DIAGNOSIS — Z751 Person awaiting admission to adequate facility elsewhere: Secondary | ICD-10-CM | POA: Diagnosis not present

## 2017-12-04 DIAGNOSIS — I714 Abdominal aortic aneurysm, without rupture: Secondary | ICD-10-CM | POA: Diagnosis not present

## 2017-12-04 DIAGNOSIS — K219 Gastro-esophageal reflux disease without esophagitis: Secondary | ICD-10-CM | POA: Diagnosis not present

## 2017-12-04 DIAGNOSIS — Z590 Homelessness: Secondary | ICD-10-CM | POA: Diagnosis not present

## 2017-12-04 DIAGNOSIS — J45909 Unspecified asthma, uncomplicated: Secondary | ICD-10-CM | POA: Diagnosis not present

## 2017-12-04 DIAGNOSIS — N390 Urinary tract infection, site not specified: Secondary | ICD-10-CM | POA: Diagnosis not present

## 2017-12-04 DIAGNOSIS — I10 Essential (primary) hypertension: Secondary | ICD-10-CM | POA: Diagnosis not present

## 2017-12-04 DIAGNOSIS — Z79899 Other long term (current) drug therapy: Secondary | ICD-10-CM | POA: Diagnosis not present

## 2017-12-05 DIAGNOSIS — I714 Abdominal aortic aneurysm, without rupture: Secondary | ICD-10-CM | POA: Diagnosis not present

## 2017-12-05 DIAGNOSIS — Z751 Person awaiting admission to adequate facility elsewhere: Secondary | ICD-10-CM | POA: Diagnosis not present

## 2017-12-05 DIAGNOSIS — N390 Urinary tract infection, site not specified: Secondary | ICD-10-CM | POA: Diagnosis not present

## 2017-12-05 DIAGNOSIS — K219 Gastro-esophageal reflux disease without esophagitis: Secondary | ICD-10-CM | POA: Diagnosis not present

## 2017-12-05 DIAGNOSIS — J45909 Unspecified asthma, uncomplicated: Secondary | ICD-10-CM | POA: Diagnosis not present

## 2017-12-05 DIAGNOSIS — Z79899 Other long term (current) drug therapy: Secondary | ICD-10-CM | POA: Diagnosis not present

## 2017-12-05 DIAGNOSIS — I10 Essential (primary) hypertension: Secondary | ICD-10-CM | POA: Diagnosis not present

## 2017-12-05 DIAGNOSIS — Z590 Homelessness: Secondary | ICD-10-CM | POA: Diagnosis not present

## 2017-12-06 DIAGNOSIS — Z590 Homelessness: Secondary | ICD-10-CM | POA: Diagnosis not present

## 2017-12-06 DIAGNOSIS — Z751 Person awaiting admission to adequate facility elsewhere: Secondary | ICD-10-CM | POA: Diagnosis not present

## 2017-12-06 DIAGNOSIS — N390 Urinary tract infection, site not specified: Secondary | ICD-10-CM | POA: Diagnosis not present

## 2017-12-06 DIAGNOSIS — I10 Essential (primary) hypertension: Secondary | ICD-10-CM | POA: Diagnosis not present

## 2017-12-06 DIAGNOSIS — I714 Abdominal aortic aneurysm, without rupture: Secondary | ICD-10-CM | POA: Diagnosis not present

## 2017-12-06 DIAGNOSIS — Z79899 Other long term (current) drug therapy: Secondary | ICD-10-CM | POA: Diagnosis not present

## 2017-12-06 DIAGNOSIS — K219 Gastro-esophageal reflux disease without esophagitis: Secondary | ICD-10-CM | POA: Diagnosis not present

## 2017-12-06 DIAGNOSIS — J45909 Unspecified asthma, uncomplicated: Secondary | ICD-10-CM | POA: Diagnosis not present

## 2017-12-07 DIAGNOSIS — I10 Essential (primary) hypertension: Secondary | ICD-10-CM | POA: Diagnosis not present

## 2017-12-07 DIAGNOSIS — Z590 Homelessness: Secondary | ICD-10-CM | POA: Diagnosis not present

## 2017-12-07 DIAGNOSIS — Z751 Person awaiting admission to adequate facility elsewhere: Secondary | ICD-10-CM | POA: Diagnosis not present

## 2017-12-07 DIAGNOSIS — J45909 Unspecified asthma, uncomplicated: Secondary | ICD-10-CM | POA: Diagnosis not present

## 2017-12-07 DIAGNOSIS — N390 Urinary tract infection, site not specified: Secondary | ICD-10-CM | POA: Diagnosis not present

## 2017-12-07 DIAGNOSIS — I714 Abdominal aortic aneurysm, without rupture: Secondary | ICD-10-CM | POA: Diagnosis not present

## 2017-12-07 DIAGNOSIS — K219 Gastro-esophageal reflux disease without esophagitis: Secondary | ICD-10-CM | POA: Diagnosis not present

## 2017-12-07 DIAGNOSIS — Z79899 Other long term (current) drug therapy: Secondary | ICD-10-CM | POA: Diagnosis not present

## 2017-12-08 DIAGNOSIS — I714 Abdominal aortic aneurysm, without rupture: Secondary | ICD-10-CM | POA: Diagnosis not present

## 2017-12-08 DIAGNOSIS — Z751 Person awaiting admission to adequate facility elsewhere: Secondary | ICD-10-CM | POA: Diagnosis not present

## 2017-12-08 DIAGNOSIS — K219 Gastro-esophageal reflux disease without esophagitis: Secondary | ICD-10-CM | POA: Diagnosis not present

## 2017-12-08 DIAGNOSIS — I10 Essential (primary) hypertension: Secondary | ICD-10-CM | POA: Diagnosis not present

## 2017-12-08 DIAGNOSIS — J45909 Unspecified asthma, uncomplicated: Secondary | ICD-10-CM | POA: Diagnosis not present

## 2017-12-08 DIAGNOSIS — Z590 Homelessness: Secondary | ICD-10-CM | POA: Diagnosis not present

## 2017-12-08 DIAGNOSIS — Z79899 Other long term (current) drug therapy: Secondary | ICD-10-CM | POA: Diagnosis not present

## 2017-12-08 DIAGNOSIS — N390 Urinary tract infection, site not specified: Secondary | ICD-10-CM | POA: Diagnosis not present

## 2017-12-09 DIAGNOSIS — Z590 Homelessness: Secondary | ICD-10-CM | POA: Diagnosis not present

## 2017-12-09 DIAGNOSIS — I10 Essential (primary) hypertension: Secondary | ICD-10-CM | POA: Diagnosis not present

## 2017-12-09 DIAGNOSIS — Z751 Person awaiting admission to adequate facility elsewhere: Secondary | ICD-10-CM | POA: Diagnosis not present

## 2017-12-09 DIAGNOSIS — K219 Gastro-esophageal reflux disease without esophagitis: Secondary | ICD-10-CM | POA: Diagnosis not present

## 2017-12-09 DIAGNOSIS — Z79899 Other long term (current) drug therapy: Secondary | ICD-10-CM | POA: Diagnosis not present

## 2017-12-09 DIAGNOSIS — N390 Urinary tract infection, site not specified: Secondary | ICD-10-CM | POA: Diagnosis not present

## 2017-12-09 DIAGNOSIS — I714 Abdominal aortic aneurysm, without rupture: Secondary | ICD-10-CM | POA: Diagnosis not present

## 2017-12-09 DIAGNOSIS — J45909 Unspecified asthma, uncomplicated: Secondary | ICD-10-CM | POA: Diagnosis not present

## 2017-12-10 DIAGNOSIS — K219 Gastro-esophageal reflux disease without esophagitis: Secondary | ICD-10-CM | POA: Diagnosis not present

## 2017-12-10 DIAGNOSIS — I10 Essential (primary) hypertension: Secondary | ICD-10-CM | POA: Diagnosis not present

## 2017-12-10 DIAGNOSIS — J45909 Unspecified asthma, uncomplicated: Secondary | ICD-10-CM | POA: Diagnosis not present

## 2017-12-10 DIAGNOSIS — N390 Urinary tract infection, site not specified: Secondary | ICD-10-CM | POA: Diagnosis not present

## 2017-12-10 DIAGNOSIS — I714 Abdominal aortic aneurysm, without rupture: Secondary | ICD-10-CM | POA: Diagnosis not present

## 2017-12-10 DIAGNOSIS — Z590 Homelessness: Secondary | ICD-10-CM | POA: Diagnosis not present

## 2017-12-10 DIAGNOSIS — Z79899 Other long term (current) drug therapy: Secondary | ICD-10-CM | POA: Diagnosis not present

## 2017-12-10 DIAGNOSIS — Z751 Person awaiting admission to adequate facility elsewhere: Secondary | ICD-10-CM | POA: Diagnosis not present

## 2017-12-11 DIAGNOSIS — Z751 Person awaiting admission to adequate facility elsewhere: Secondary | ICD-10-CM | POA: Diagnosis not present

## 2017-12-11 DIAGNOSIS — Z590 Homelessness: Secondary | ICD-10-CM | POA: Diagnosis not present

## 2017-12-11 DIAGNOSIS — I10 Essential (primary) hypertension: Secondary | ICD-10-CM | POA: Diagnosis not present

## 2017-12-11 DIAGNOSIS — I714 Abdominal aortic aneurysm, without rupture: Secondary | ICD-10-CM | POA: Diagnosis not present

## 2017-12-11 DIAGNOSIS — Z79899 Other long term (current) drug therapy: Secondary | ICD-10-CM | POA: Diagnosis not present

## 2017-12-11 DIAGNOSIS — K219 Gastro-esophageal reflux disease without esophagitis: Secondary | ICD-10-CM | POA: Diagnosis not present

## 2017-12-11 DIAGNOSIS — J45909 Unspecified asthma, uncomplicated: Secondary | ICD-10-CM | POA: Diagnosis not present

## 2017-12-11 DIAGNOSIS — N390 Urinary tract infection, site not specified: Secondary | ICD-10-CM | POA: Diagnosis not present

## 2017-12-12 DIAGNOSIS — K219 Gastro-esophageal reflux disease without esophagitis: Secondary | ICD-10-CM | POA: Diagnosis not present

## 2017-12-12 DIAGNOSIS — Z79899 Other long term (current) drug therapy: Secondary | ICD-10-CM | POA: Diagnosis not present

## 2017-12-12 DIAGNOSIS — J45909 Unspecified asthma, uncomplicated: Secondary | ICD-10-CM | POA: Diagnosis not present

## 2017-12-12 DIAGNOSIS — I10 Essential (primary) hypertension: Secondary | ICD-10-CM | POA: Diagnosis not present

## 2017-12-12 DIAGNOSIS — I714 Abdominal aortic aneurysm, without rupture: Secondary | ICD-10-CM | POA: Diagnosis not present

## 2017-12-12 DIAGNOSIS — N390 Urinary tract infection, site not specified: Secondary | ICD-10-CM | POA: Diagnosis not present

## 2017-12-12 DIAGNOSIS — Z590 Homelessness: Secondary | ICD-10-CM | POA: Diagnosis not present

## 2017-12-12 DIAGNOSIS — Z751 Person awaiting admission to adequate facility elsewhere: Secondary | ICD-10-CM | POA: Diagnosis not present

## 2017-12-13 DIAGNOSIS — I10 Essential (primary) hypertension: Secondary | ICD-10-CM | POA: Diagnosis not present

## 2017-12-13 DIAGNOSIS — J45998 Other asthma: Secondary | ICD-10-CM | POA: Diagnosis not present

## 2017-12-13 DIAGNOSIS — K5790 Diverticulosis of intestine, part unspecified, without perforation or abscess without bleeding: Secondary | ICD-10-CM | POA: Diagnosis not present

## 2017-12-13 DIAGNOSIS — M1990 Unspecified osteoarthritis, unspecified site: Secondary | ICD-10-CM | POA: Diagnosis not present

## 2017-12-14 DIAGNOSIS — N760 Acute vaginitis: Secondary | ICD-10-CM | POA: Diagnosis not present

## 2017-12-14 DIAGNOSIS — I8291 Chronic embolism and thrombosis of unspecified vein: Secondary | ICD-10-CM | POA: Diagnosis not present

## 2017-12-14 DIAGNOSIS — D649 Anemia, unspecified: Secondary | ICD-10-CM | POA: Diagnosis not present

## 2017-12-14 DIAGNOSIS — M069 Rheumatoid arthritis, unspecified: Secondary | ICD-10-CM | POA: Diagnosis not present

## 2017-12-14 DIAGNOSIS — Z79899 Other long term (current) drug therapy: Secondary | ICD-10-CM | POA: Diagnosis not present

## 2017-12-15 DIAGNOSIS — D649 Anemia, unspecified: Secondary | ICD-10-CM | POA: Diagnosis not present

## 2017-12-15 DIAGNOSIS — J45998 Other asthma: Secondary | ICD-10-CM | POA: Diagnosis not present

## 2017-12-15 DIAGNOSIS — R0602 Shortness of breath: Secondary | ICD-10-CM | POA: Diagnosis not present

## 2017-12-16 DIAGNOSIS — I714 Abdominal aortic aneurysm, without rupture: Secondary | ICD-10-CM | POA: Diagnosis not present

## 2017-12-16 DIAGNOSIS — J4520 Mild intermittent asthma, uncomplicated: Secondary | ICD-10-CM | POA: Diagnosis not present

## 2017-12-17 DIAGNOSIS — J4520 Mild intermittent asthma, uncomplicated: Secondary | ICD-10-CM | POA: Diagnosis not present

## 2017-12-19 DIAGNOSIS — Z79899 Other long term (current) drug therapy: Secondary | ICD-10-CM | POA: Diagnosis not present

## 2017-12-20 DIAGNOSIS — Z79899 Other long term (current) drug therapy: Secondary | ICD-10-CM | POA: Diagnosis not present

## 2017-12-21 DIAGNOSIS — Z79899 Other long term (current) drug therapy: Secondary | ICD-10-CM | POA: Diagnosis not present

## 2017-12-22 DIAGNOSIS — Z79899 Other long term (current) drug therapy: Secondary | ICD-10-CM | POA: Diagnosis not present

## 2017-12-24 DIAGNOSIS — Z79899 Other long term (current) drug therapy: Secondary | ICD-10-CM | POA: Diagnosis not present

## 2018-01-04 DIAGNOSIS — R300 Dysuria: Secondary | ICD-10-CM | POA: Diagnosis not present

## 2018-01-04 DIAGNOSIS — R350 Frequency of micturition: Secondary | ICD-10-CM | POA: Diagnosis not present

## 2018-01-05 DIAGNOSIS — Z79899 Other long term (current) drug therapy: Secondary | ICD-10-CM | POA: Diagnosis not present

## 2018-01-07 DIAGNOSIS — R300 Dysuria: Secondary | ICD-10-CM | POA: Diagnosis not present

## 2018-01-10 DIAGNOSIS — R8290 Unspecified abnormal findings in urine: Secondary | ICD-10-CM | POA: Diagnosis not present

## 2018-01-11 DIAGNOSIS — N390 Urinary tract infection, site not specified: Secondary | ICD-10-CM | POA: Diagnosis not present

## 2018-01-12 DIAGNOSIS — N390 Urinary tract infection, site not specified: Secondary | ICD-10-CM | POA: Diagnosis not present

## 2018-01-23 DIAGNOSIS — I10 Essential (primary) hypertension: Secondary | ICD-10-CM | POA: Diagnosis not present

## 2022-08-14 ENCOUNTER — Ambulatory Visit (INDEPENDENT_AMBULATORY_CARE_PROVIDER_SITE_OTHER): Payer: Medicare Other | Admitting: Podiatry

## 2022-08-14 DIAGNOSIS — B351 Tinea unguium: Secondary | ICD-10-CM | POA: Diagnosis not present

## 2022-08-14 DIAGNOSIS — M79674 Pain in right toe(s): Secondary | ICD-10-CM

## 2022-08-14 DIAGNOSIS — M79675 Pain in left toe(s): Secondary | ICD-10-CM | POA: Diagnosis not present

## 2022-08-14 NOTE — Progress Notes (Signed)
  Subjective:  Patient ID: [name], female    DOB: 30-Mar-1946,  MRN: [id number]  Chief Complaint  Patient presents with   Nail Problem    Enlarged and hypertropic nails   Ankle Pain    Ankle -fell and it was swollen    76 y.o. female presents with the above complaint. History confirmed with patient. Patient presenting with pain related to dystrophic thickened elongated nails. Patient is unable to trim own nails related to nail dystrophy and/or mobility issues. Patient does not have a history of T2DM. Pt reports recent fall and had XR at [facility]. Was told no fracture present. Does think the left 2nd toenail is deformed from fracture vs hammertoe chronic deformity.  Objective:  Physical Exam: warm, good capillary refill nail exam onychomycosis of the toenails, onycholysis, and dystrophic nails DP pulses palpable, PT pulses palpable, and protective sensation intact Left Foot:  Pain with palpation of nails due to elongation and dystrophic growth.  Right Foot: Pain with palpation of nails due to elongation and dystrophic growth.   Recent XR foot / ankle left: no evidence of fracture of the 2nd toe or left ankle  Assessment:   1. Pain due to onychomycosis of toenails of both feet      Plan:  Patient was evaluated and treated and all questions answered.   #Onychomycosis with pain  -Nails palliatively debrided as below. -Educated on self-care  Procedure: Nail Debridement Rationale: Pain Type of Debridement: manual, sharp debridement. Instrumentation: Nail nipper, rotary burr. Number of Nails: 10  Return in about 3 months (around 11/14/2022) for RFC.         Corinna Gab, DPM [facility] / [facility]

## 2022-08-28 ENCOUNTER — Ambulatory Visit: Payer: Medicare Other | Admitting: Allergy and Immunology

## 2022-09-14 DIAGNOSIS — I10 Essential (primary) hypertension: Secondary | ICD-10-CM | POA: Diagnosis not present

## 2022-09-14 DIAGNOSIS — K219 Gastro-esophageal reflux disease without esophagitis: Secondary | ICD-10-CM | POA: Diagnosis not present

## 2022-09-14 DIAGNOSIS — F23 Brief psychotic disorder: Secondary | ICD-10-CM | POA: Diagnosis not present

## 2022-09-14 DIAGNOSIS — E7800 Pure hypercholesterolemia, unspecified: Secondary | ICD-10-CM | POA: Diagnosis not present

## 2022-09-14 DIAGNOSIS — Z046 Encounter for general psychiatric examination, requested by authority: Secondary | ICD-10-CM | POA: Diagnosis not present

## 2022-09-14 DIAGNOSIS — J45909 Unspecified asthma, uncomplicated: Secondary | ICD-10-CM | POA: Diagnosis not present

## 2022-09-14 DIAGNOSIS — F17210 Nicotine dependence, cigarettes, uncomplicated: Secondary | ICD-10-CM | POA: Diagnosis not present

## 2022-09-14 DIAGNOSIS — F319 Bipolar disorder, unspecified: Secondary | ICD-10-CM | POA: Diagnosis not present

## 2022-09-14 DIAGNOSIS — Z5900 Homelessness unspecified: Secondary | ICD-10-CM | POA: Diagnosis not present

## 2022-09-15 DIAGNOSIS — F319 Bipolar disorder, unspecified: Secondary | ICD-10-CM | POA: Diagnosis not present

## 2022-09-16 DIAGNOSIS — F319 Bipolar disorder, unspecified: Secondary | ICD-10-CM | POA: Diagnosis not present

## 2022-09-17 DIAGNOSIS — F319 Bipolar disorder, unspecified: Secondary | ICD-10-CM | POA: Diagnosis not present

## 2022-09-18 DIAGNOSIS — F29 Unspecified psychosis not due to a substance or known physiological condition: Secondary | ICD-10-CM | POA: Diagnosis not present

## 2022-09-18 DIAGNOSIS — F319 Bipolar disorder, unspecified: Secondary | ICD-10-CM | POA: Diagnosis not present

## 2022-09-19 DIAGNOSIS — F319 Bipolar disorder, unspecified: Secondary | ICD-10-CM | POA: Diagnosis not present

## 2022-09-19 DIAGNOSIS — F29 Unspecified psychosis not due to a substance or known physiological condition: Secondary | ICD-10-CM | POA: Diagnosis not present

## 2022-09-20 DIAGNOSIS — F3181 Bipolar II disorder: Secondary | ICD-10-CM | POA: Diagnosis not present

## 2022-09-20 DIAGNOSIS — R600 Localized edema: Secondary | ICD-10-CM | POA: Diagnosis not present

## 2022-09-20 DIAGNOSIS — F419 Anxiety disorder, unspecified: Secondary | ICD-10-CM | POA: Diagnosis not present

## 2022-09-20 DIAGNOSIS — E7800 Pure hypercholesterolemia, unspecified: Secondary | ICD-10-CM | POA: Diagnosis not present

## 2022-09-20 DIAGNOSIS — R058 Other specified cough: Secondary | ICD-10-CM | POA: Diagnosis not present

## 2022-09-20 DIAGNOSIS — G8929 Other chronic pain: Secondary | ICD-10-CM | POA: Diagnosis not present

## 2022-09-20 DIAGNOSIS — M25511 Pain in right shoulder: Secondary | ICD-10-CM | POA: Diagnosis not present

## 2022-09-20 DIAGNOSIS — M79671 Pain in right foot: Secondary | ICD-10-CM | POA: Diagnosis not present

## 2022-09-20 DIAGNOSIS — R45851 Suicidal ideations: Secondary | ICD-10-CM | POA: Diagnosis not present

## 2022-09-20 DIAGNOSIS — J45998 Other asthma: Secondary | ICD-10-CM | POA: Diagnosis not present

## 2022-09-20 DIAGNOSIS — R197 Diarrhea, unspecified: Secondary | ICD-10-CM | POA: Diagnosis not present

## 2022-09-20 DIAGNOSIS — K5909 Other constipation: Secondary | ICD-10-CM | POA: Diagnosis not present

## 2022-09-20 DIAGNOSIS — M542 Cervicalgia: Secondary | ICD-10-CM | POA: Diagnosis not present

## 2022-09-20 DIAGNOSIS — F600 Paranoid personality disorder: Secondary | ICD-10-CM | POA: Diagnosis not present

## 2022-09-20 DIAGNOSIS — R5383 Other fatigue: Secondary | ICD-10-CM | POA: Diagnosis not present

## 2022-09-20 DIAGNOSIS — F23 Brief psychotic disorder: Secondary | ICD-10-CM | POA: Diagnosis not present

## 2022-09-20 DIAGNOSIS — M5459 Other low back pain: Secondary | ICD-10-CM | POA: Diagnosis not present

## 2022-09-20 DIAGNOSIS — F17210 Nicotine dependence, cigarettes, uncomplicated: Secondary | ICD-10-CM | POA: Diagnosis not present

## 2022-09-20 DIAGNOSIS — K219 Gastro-esophageal reflux disease without esophagitis: Secondary | ICD-10-CM | POA: Diagnosis not present

## 2022-09-20 DIAGNOSIS — M7918 Myalgia, other site: Secondary | ICD-10-CM | POA: Diagnosis not present

## 2022-09-20 DIAGNOSIS — I10 Essential (primary) hypertension: Secondary | ICD-10-CM | POA: Diagnosis not present

## 2022-09-20 DIAGNOSIS — R2689 Other abnormalities of gait and mobility: Secondary | ICD-10-CM | POA: Diagnosis not present

## 2022-09-20 DIAGNOSIS — R0981 Nasal congestion: Secondary | ICD-10-CM | POA: Diagnosis not present

## 2022-09-20 DIAGNOSIS — F29 Unspecified psychosis not due to a substance or known physiological condition: Secondary | ICD-10-CM | POA: Diagnosis not present

## 2022-09-20 DIAGNOSIS — M25512 Pain in left shoulder: Secondary | ICD-10-CM | POA: Diagnosis not present

## 2022-09-20 DIAGNOSIS — F3164 Bipolar disorder, current episode mixed, severe, with psychotic features: Secondary | ICD-10-CM | POA: Diagnosis not present

## 2022-09-20 DIAGNOSIS — K5900 Constipation, unspecified: Secondary | ICD-10-CM | POA: Diagnosis not present

## 2022-09-20 DIAGNOSIS — Z5900 Homelessness unspecified: Secondary | ICD-10-CM | POA: Diagnosis not present

## 2022-09-20 DIAGNOSIS — F17200 Nicotine dependence, unspecified, uncomplicated: Secondary | ICD-10-CM | POA: Diagnosis not present

## 2022-09-20 DIAGNOSIS — J309 Allergic rhinitis, unspecified: Secondary | ICD-10-CM | POA: Diagnosis not present

## 2022-09-20 DIAGNOSIS — J069 Acute upper respiratory infection, unspecified: Secondary | ICD-10-CM | POA: Diagnosis not present

## 2022-09-20 DIAGNOSIS — R440 Auditory hallucinations: Secondary | ICD-10-CM | POA: Diagnosis not present

## 2022-09-20 DIAGNOSIS — J45909 Unspecified asthma, uncomplicated: Secondary | ICD-10-CM | POA: Diagnosis not present

## 2022-09-20 DIAGNOSIS — R609 Edema, unspecified: Secondary | ICD-10-CM | POA: Diagnosis not present

## 2022-09-20 DIAGNOSIS — E559 Vitamin D deficiency, unspecified: Secondary | ICD-10-CM | POA: Diagnosis not present

## 2022-09-20 DIAGNOSIS — D509 Iron deficiency anemia, unspecified: Secondary | ICD-10-CM | POA: Diagnosis not present

## 2022-09-20 DIAGNOSIS — J449 Chronic obstructive pulmonary disease, unspecified: Secondary | ICD-10-CM | POA: Diagnosis not present

## 2022-09-20 DIAGNOSIS — Z046 Encounter for general psychiatric examination, requested by authority: Secondary | ICD-10-CM | POA: Diagnosis not present

## 2022-09-20 DIAGNOSIS — Z79899 Other long term (current) drug therapy: Secondary | ICD-10-CM | POA: Diagnosis not present

## 2022-09-20 DIAGNOSIS — F319 Bipolar disorder, unspecified: Secondary | ICD-10-CM | POA: Diagnosis not present

## 2022-09-21 ENCOUNTER — Ambulatory Visit: Payer: Medicare Other | Admitting: Allergy and Immunology

## 2022-09-21 DIAGNOSIS — E559 Vitamin D deficiency, unspecified: Secondary | ICD-10-CM | POA: Diagnosis not present

## 2022-09-21 DIAGNOSIS — R45851 Suicidal ideations: Secondary | ICD-10-CM | POA: Diagnosis not present

## 2022-09-21 DIAGNOSIS — F319 Bipolar disorder, unspecified: Secondary | ICD-10-CM | POA: Diagnosis not present

## 2022-09-21 DIAGNOSIS — Z79899 Other long term (current) drug therapy: Secondary | ICD-10-CM | POA: Diagnosis not present

## 2022-10-17 DIAGNOSIS — F4310 Post-traumatic stress disorder, unspecified: Secondary | ICD-10-CM | POA: Diagnosis not present

## 2022-10-17 DIAGNOSIS — F330 Major depressive disorder, recurrent, mild: Secondary | ICD-10-CM | POA: Diagnosis not present

## 2022-10-24 DIAGNOSIS — F3160 Bipolar disorder, current episode mixed, unspecified: Secondary | ICD-10-CM | POA: Diagnosis not present

## 2022-10-24 DIAGNOSIS — F411 Generalized anxiety disorder: Secondary | ICD-10-CM | POA: Diagnosis not present

## 2022-10-24 DIAGNOSIS — Z6821 Body mass index (BMI) 21.0-21.9, adult: Secondary | ICD-10-CM | POA: Diagnosis not present

## 2022-10-24 DIAGNOSIS — M2042 Other hammer toe(s) (acquired), left foot: Secondary | ICD-10-CM | POA: Diagnosis not present

## 2022-10-24 DIAGNOSIS — J0100 Acute maxillary sinusitis, unspecified: Secondary | ICD-10-CM | POA: Diagnosis not present

## 2022-10-24 DIAGNOSIS — R051 Acute cough: Secondary | ICD-10-CM | POA: Diagnosis not present

## 2022-10-31 DIAGNOSIS — F411 Generalized anxiety disorder: Secondary | ICD-10-CM | POA: Diagnosis not present

## 2022-11-07 DIAGNOSIS — Z91038 Other insect allergy status: Secondary | ICD-10-CM | POA: Diagnosis not present

## 2022-11-07 DIAGNOSIS — M5416 Radiculopathy, lumbar region: Secondary | ICD-10-CM | POA: Diagnosis not present

## 2022-11-07 DIAGNOSIS — Z6820 Body mass index (BMI) 20.0-20.9, adult: Secondary | ICD-10-CM | POA: Diagnosis not present

## 2022-11-07 DIAGNOSIS — J0100 Acute maxillary sinusitis, unspecified: Secondary | ICD-10-CM | POA: Diagnosis not present

## 2022-11-07 DIAGNOSIS — F411 Generalized anxiety disorder: Secondary | ICD-10-CM | POA: Diagnosis not present

## 2022-11-14 ENCOUNTER — Ambulatory Visit (INDEPENDENT_AMBULATORY_CARE_PROVIDER_SITE_OTHER): Payer: Medicare HMO | Admitting: Podiatry

## 2022-11-14 DIAGNOSIS — Z91199 Patient's noncompliance with other medical treatment and regimen due to unspecified reason: Secondary | ICD-10-CM

## 2022-11-14 NOTE — Progress Notes (Signed)
Pt was a no show for apt, charge generated

## 2022-11-30 DIAGNOSIS — H5203 Hypermetropia, bilateral: Secondary | ICD-10-CM | POA: Diagnosis not present

## 2022-11-30 DIAGNOSIS — H353113 Nonexudative age-related macular degeneration, right eye, advanced atrophic without subfoveal involvement: Secondary | ICD-10-CM | POA: Diagnosis not present

## 2022-11-30 DIAGNOSIS — Z0100 Encounter for examination of eyes and vision without abnormal findings: Secondary | ICD-10-CM | POA: Diagnosis not present

## 2022-11-30 DIAGNOSIS — H2513 Age-related nuclear cataract, bilateral: Secondary | ICD-10-CM | POA: Diagnosis not present

## 2022-11-30 DIAGNOSIS — H52223 Regular astigmatism, bilateral: Secondary | ICD-10-CM | POA: Diagnosis not present

## 2022-11-30 DIAGNOSIS — H353221 Exudative age-related macular degeneration, left eye, with active choroidal neovascularization: Secondary | ICD-10-CM | POA: Diagnosis not present

## 2022-11-30 DIAGNOSIS — H524 Presbyopia: Secondary | ICD-10-CM | POA: Diagnosis not present

## 2022-11-30 DIAGNOSIS — H353124 Nonexudative age-related macular degeneration, left eye, advanced atrophic with subfoveal involvement: Secondary | ICD-10-CM | POA: Diagnosis not present

## 2022-12-05 DIAGNOSIS — Z5900 Homelessness unspecified: Secondary | ICD-10-CM | POA: Diagnosis not present

## 2022-12-05 DIAGNOSIS — M2042 Other hammer toe(s) (acquired), left foot: Secondary | ICD-10-CM | POA: Diagnosis not present

## 2022-12-05 DIAGNOSIS — Z6820 Body mass index (BMI) 20.0-20.9, adult: Secondary | ICD-10-CM | POA: Diagnosis not present

## 2022-12-05 DIAGNOSIS — F4310 Post-traumatic stress disorder, unspecified: Secondary | ICD-10-CM | POA: Diagnosis not present

## 2022-12-05 DIAGNOSIS — J4520 Mild intermittent asthma, uncomplicated: Secondary | ICD-10-CM | POA: Diagnosis not present

## 2022-12-12 DIAGNOSIS — H353134 Nonexudative age-related macular degeneration, bilateral, advanced atrophic with subfoveal involvement: Secondary | ICD-10-CM | POA: Diagnosis not present

## 2022-12-19 DIAGNOSIS — M2042 Other hammer toe(s) (acquired), left foot: Secondary | ICD-10-CM | POA: Diagnosis not present

## 2022-12-19 DIAGNOSIS — G629 Polyneuropathy, unspecified: Secondary | ICD-10-CM | POA: Diagnosis not present

## 2022-12-19 DIAGNOSIS — F4310 Post-traumatic stress disorder, unspecified: Secondary | ICD-10-CM | POA: Diagnosis not present

## 2022-12-19 DIAGNOSIS — F411 Generalized anxiety disorder: Secondary | ICD-10-CM | POA: Diagnosis not present

## 2022-12-19 DIAGNOSIS — Z5900 Homelessness unspecified: Secondary | ICD-10-CM | POA: Diagnosis not present

## 2022-12-19 DIAGNOSIS — J4520 Mild intermittent asthma, uncomplicated: Secondary | ICD-10-CM | POA: Diagnosis not present

## 2022-12-19 DIAGNOSIS — Z6820 Body mass index (BMI) 20.0-20.9, adult: Secondary | ICD-10-CM | POA: Diagnosis not present

## 2022-12-26 DIAGNOSIS — F4310 Post-traumatic stress disorder, unspecified: Secondary | ICD-10-CM | POA: Diagnosis not present

## 2022-12-26 DIAGNOSIS — Z5900 Homelessness unspecified: Secondary | ICD-10-CM | POA: Diagnosis not present

## 2022-12-26 DIAGNOSIS — F411 Generalized anxiety disorder: Secondary | ICD-10-CM | POA: Diagnosis not present

## 2023-01-16 DIAGNOSIS — B9689 Other specified bacterial agents as the cause of diseases classified elsewhere: Secondary | ICD-10-CM | POA: Diagnosis not present

## 2023-01-16 DIAGNOSIS — Z681 Body mass index (BMI) 19 or less, adult: Secondary | ICD-10-CM | POA: Diagnosis not present

## 2023-01-16 DIAGNOSIS — J019 Acute sinusitis, unspecified: Secondary | ICD-10-CM | POA: Diagnosis not present

## 2023-01-30 DIAGNOSIS — Z6820 Body mass index (BMI) 20.0-20.9, adult: Secondary | ICD-10-CM | POA: Diagnosis not present

## 2023-01-30 DIAGNOSIS — B9689 Other specified bacterial agents as the cause of diseases classified elsewhere: Secondary | ICD-10-CM | POA: Diagnosis not present

## 2023-01-30 DIAGNOSIS — J019 Acute sinusitis, unspecified: Secondary | ICD-10-CM | POA: Diagnosis not present

## 2023-01-30 DIAGNOSIS — M79672 Pain in left foot: Secondary | ICD-10-CM | POA: Diagnosis not present

## 2023-02-13 DIAGNOSIS — R6889 Other general symptoms and signs: Secondary | ICD-10-CM | POA: Diagnosis not present

## 2023-02-13 DIAGNOSIS — M79672 Pain in left foot: Secondary | ICD-10-CM | POA: Diagnosis not present

## 2023-02-13 DIAGNOSIS — B351 Tinea unguium: Secondary | ICD-10-CM | POA: Diagnosis not present

## 2023-02-13 DIAGNOSIS — M2042 Other hammer toe(s) (acquired), left foot: Secondary | ICD-10-CM | POA: Diagnosis not present

## 2023-02-13 DIAGNOSIS — Z008 Encounter for other general examination: Secondary | ICD-10-CM | POA: Diagnosis not present

## 2023-02-13 DIAGNOSIS — Z6820 Body mass index (BMI) 20.0-20.9, adult: Secondary | ICD-10-CM | POA: Diagnosis not present

## 2023-02-20 DIAGNOSIS — Z5900 Homelessness unspecified: Secondary | ICD-10-CM | POA: Diagnosis not present

## 2023-02-20 DIAGNOSIS — F4310 Post-traumatic stress disorder, unspecified: Secondary | ICD-10-CM | POA: Diagnosis not present

## 2023-02-20 DIAGNOSIS — F411 Generalized anxiety disorder: Secondary | ICD-10-CM | POA: Diagnosis not present

## 2023-02-21 DIAGNOSIS — M79672 Pain in left foot: Secondary | ICD-10-CM | POA: Diagnosis not present

## 2023-02-23 DIAGNOSIS — R6889 Other general symptoms and signs: Secondary | ICD-10-CM | POA: Diagnosis not present

## 2023-02-27 DIAGNOSIS — Z5900 Homelessness unspecified: Secondary | ICD-10-CM | POA: Diagnosis not present

## 2023-02-27 DIAGNOSIS — F411 Generalized anxiety disorder: Secondary | ICD-10-CM | POA: Diagnosis not present

## 2023-02-27 DIAGNOSIS — F4310 Post-traumatic stress disorder, unspecified: Secondary | ICD-10-CM | POA: Diagnosis not present

## 2023-04-03 DIAGNOSIS — M25511 Pain in right shoulder: Secondary | ICD-10-CM | POA: Diagnosis not present

## 2023-04-03 DIAGNOSIS — J4520 Mild intermittent asthma, uncomplicated: Secondary | ICD-10-CM | POA: Diagnosis not present

## 2023-04-10 DIAGNOSIS — J209 Acute bronchitis, unspecified: Secondary | ICD-10-CM | POA: Diagnosis not present

## 2023-04-10 DIAGNOSIS — Z6820 Body mass index (BMI) 20.0-20.9, adult: Secondary | ICD-10-CM | POA: Diagnosis not present

## 2023-04-10 DIAGNOSIS — J0100 Acute maxillary sinusitis, unspecified: Secondary | ICD-10-CM | POA: Diagnosis not present

## 2023-04-30 DIAGNOSIS — Z5900 Homelessness unspecified: Secondary | ICD-10-CM | POA: Diagnosis not present

## 2023-04-30 DIAGNOSIS — F4310 Post-traumatic stress disorder, unspecified: Secondary | ICD-10-CM | POA: Diagnosis not present

## 2023-04-30 DIAGNOSIS — F411 Generalized anxiety disorder: Secondary | ICD-10-CM | POA: Diagnosis not present

## 2023-05-01 DIAGNOSIS — J45909 Unspecified asthma, uncomplicated: Secondary | ICD-10-CM | POA: Diagnosis not present

## 2023-05-01 DIAGNOSIS — J4520 Mild intermittent asthma, uncomplicated: Secondary | ICD-10-CM | POA: Diagnosis not present

## 2023-05-01 DIAGNOSIS — Z6821 Body mass index (BMI) 21.0-21.9, adult: Secondary | ICD-10-CM | POA: Diagnosis not present

## 2023-05-08 DIAGNOSIS — F411 Generalized anxiety disorder: Secondary | ICD-10-CM | POA: Diagnosis not present

## 2023-05-08 DIAGNOSIS — Z5900 Homelessness unspecified: Secondary | ICD-10-CM | POA: Diagnosis not present

## 2023-05-08 DIAGNOSIS — F4310 Post-traumatic stress disorder, unspecified: Secondary | ICD-10-CM | POA: Diagnosis not present

## 2023-05-16 DIAGNOSIS — Z91038 Other insect allergy status: Secondary | ICD-10-CM | POA: Diagnosis not present

## 2023-05-16 DIAGNOSIS — Z6821 Body mass index (BMI) 21.0-21.9, adult: Secondary | ICD-10-CM | POA: Diagnosis not present

## 2023-05-16 DIAGNOSIS — J4520 Mild intermittent asthma, uncomplicated: Secondary | ICD-10-CM | POA: Diagnosis not present

## 2023-05-31 DIAGNOSIS — S3993XA Unspecified injury of pelvis, initial encounter: Secondary | ICD-10-CM | POA: Diagnosis not present

## 2023-05-31 DIAGNOSIS — S199XXA Unspecified injury of neck, initial encounter: Secondary | ICD-10-CM | POA: Diagnosis not present

## 2023-05-31 DIAGNOSIS — F4310 Post-traumatic stress disorder, unspecified: Secondary | ICD-10-CM | POA: Diagnosis not present

## 2023-05-31 DIAGNOSIS — R4020 Unspecified coma: Secondary | ICD-10-CM | POA: Diagnosis not present

## 2023-05-31 DIAGNOSIS — S065XAA Traumatic subdural hemorrhage with loss of consciousness status unknown, initial encounter: Secondary | ICD-10-CM | POA: Diagnosis not present

## 2023-05-31 DIAGNOSIS — Z5329 Procedure and treatment not carried out because of patient's decision for other reasons: Secondary | ICD-10-CM | POA: Diagnosis not present

## 2023-05-31 DIAGNOSIS — G4489 Other headache syndrome: Secondary | ICD-10-CM | POA: Diagnosis not present

## 2023-05-31 DIAGNOSIS — R0902 Hypoxemia: Secondary | ICD-10-CM | POA: Diagnosis not present

## 2023-05-31 DIAGNOSIS — S299XXA Unspecified injury of thorax, initial encounter: Secondary | ICD-10-CM | POA: Diagnosis not present

## 2023-05-31 DIAGNOSIS — S0990XA Unspecified injury of head, initial encounter: Secondary | ICD-10-CM | POA: Diagnosis not present

## 2023-05-31 DIAGNOSIS — S0993XA Unspecified injury of face, initial encounter: Secondary | ICD-10-CM | POA: Diagnosis not present

## 2023-05-31 DIAGNOSIS — Y33XXXA Other specified events, undetermined intent, initial encounter: Secondary | ICD-10-CM | POA: Diagnosis not present

## 2023-05-31 DIAGNOSIS — F17290 Nicotine dependence, other tobacco product, uncomplicated: Secondary | ICD-10-CM | POA: Diagnosis not present

## 2023-05-31 DIAGNOSIS — M549 Dorsalgia, unspecified: Secondary | ICD-10-CM | POA: Diagnosis not present

## 2023-05-31 DIAGNOSIS — M47812 Spondylosis without myelopathy or radiculopathy, cervical region: Secondary | ICD-10-CM | POA: Diagnosis not present

## 2023-05-31 DIAGNOSIS — S3991XA Unspecified injury of abdomen, initial encounter: Secondary | ICD-10-CM | POA: Diagnosis not present

## 2023-05-31 DIAGNOSIS — R609 Edema, unspecified: Secondary | ICD-10-CM | POA: Diagnosis not present

## 2023-05-31 DIAGNOSIS — F39 Unspecified mood [affective] disorder: Secondary | ICD-10-CM | POA: Diagnosis not present

## 2023-05-31 DIAGNOSIS — S0512XA Contusion of eyeball and orbital tissues, left eye, initial encounter: Secondary | ICD-10-CM | POA: Diagnosis not present

## 2023-05-31 DIAGNOSIS — S0990XD Unspecified injury of head, subsequent encounter: Secondary | ICD-10-CM | POA: Diagnosis not present

## 2023-05-31 DIAGNOSIS — M4312 Spondylolisthesis, cervical region: Secondary | ICD-10-CM | POA: Diagnosis not present

## 2023-06-01 DIAGNOSIS — Y33XXXA Other specified events, undetermined intent, initial encounter: Secondary | ICD-10-CM | POA: Diagnosis not present

## 2023-06-01 DIAGNOSIS — M47812 Spondylosis without myelopathy or radiculopathy, cervical region: Secondary | ICD-10-CM | POA: Diagnosis not present

## 2023-06-01 DIAGNOSIS — S299XXA Unspecified injury of thorax, initial encounter: Secondary | ICD-10-CM | POA: Diagnosis not present

## 2023-06-01 DIAGNOSIS — F39 Unspecified mood [affective] disorder: Secondary | ICD-10-CM | POA: Diagnosis not present

## 2023-06-01 DIAGNOSIS — R609 Edema, unspecified: Secondary | ICD-10-CM | POA: Diagnosis not present

## 2023-06-01 DIAGNOSIS — Z5329 Procedure and treatment not carried out because of patient's decision for other reasons: Secondary | ICD-10-CM | POA: Diagnosis not present

## 2023-06-01 DIAGNOSIS — S3991XA Unspecified injury of abdomen, initial encounter: Secondary | ICD-10-CM | POA: Diagnosis not present

## 2023-06-01 DIAGNOSIS — S0990XA Unspecified injury of head, initial encounter: Secondary | ICD-10-CM | POA: Diagnosis not present

## 2023-06-01 DIAGNOSIS — S0993XA Unspecified injury of face, initial encounter: Secondary | ICD-10-CM | POA: Diagnosis not present

## 2023-06-01 DIAGNOSIS — M4312 Spondylolisthesis, cervical region: Secondary | ICD-10-CM | POA: Diagnosis not present

## 2023-06-01 DIAGNOSIS — F17290 Nicotine dependence, other tobacco product, uncomplicated: Secondary | ICD-10-CM | POA: Diagnosis not present

## 2023-06-01 DIAGNOSIS — S065XAA Traumatic subdural hemorrhage with loss of consciousness status unknown, initial encounter: Secondary | ICD-10-CM | POA: Diagnosis not present

## 2023-06-01 DIAGNOSIS — S199XXA Unspecified injury of neck, initial encounter: Secondary | ICD-10-CM | POA: Diagnosis not present

## 2023-06-01 DIAGNOSIS — S3993XA Unspecified injury of pelvis, initial encounter: Secondary | ICD-10-CM | POA: Diagnosis not present

## 2023-06-01 DIAGNOSIS — S0990XD Unspecified injury of head, subsequent encounter: Secondary | ICD-10-CM | POA: Diagnosis not present

## 2023-06-01 DIAGNOSIS — F4310 Post-traumatic stress disorder, unspecified: Secondary | ICD-10-CM | POA: Diagnosis not present

## 2023-06-12 DIAGNOSIS — Z6821 Body mass index (BMI) 21.0-21.9, adult: Secondary | ICD-10-CM | POA: Diagnosis not present

## 2023-06-12 DIAGNOSIS — M2042 Other hammer toe(s) (acquired), left foot: Secondary | ICD-10-CM | POA: Diagnosis not present

## 2023-06-12 DIAGNOSIS — J4520 Mild intermittent asthma, uncomplicated: Secondary | ICD-10-CM | POA: Diagnosis not present

## 2023-06-12 DIAGNOSIS — J45909 Unspecified asthma, uncomplicated: Secondary | ICD-10-CM | POA: Diagnosis not present

## 2023-06-12 DIAGNOSIS — Z79899 Other long term (current) drug therapy: Secondary | ICD-10-CM | POA: Diagnosis not present

## 2023-06-12 DIAGNOSIS — J302 Other seasonal allergic rhinitis: Secondary | ICD-10-CM | POA: Diagnosis not present

## 2023-06-12 DIAGNOSIS — Z91038 Other insect allergy status: Secondary | ICD-10-CM | POA: Diagnosis not present

## 2023-06-20 DIAGNOSIS — Z1152 Encounter for screening for COVID-19: Secondary | ICD-10-CM | POA: Diagnosis not present

## 2023-06-20 DIAGNOSIS — R462 Strange and inexplicable behavior: Secondary | ICD-10-CM | POA: Diagnosis not present

## 2023-06-20 DIAGNOSIS — Z59819 Housing instability, housed unspecified: Secondary | ICD-10-CM | POA: Diagnosis not present

## 2023-06-20 DIAGNOSIS — R519 Headache, unspecified: Secondary | ICD-10-CM | POA: Diagnosis not present

## 2023-06-20 DIAGNOSIS — Z87820 Personal history of traumatic brain injury: Secondary | ICD-10-CM | POA: Diagnosis not present

## 2023-06-20 DIAGNOSIS — J4520 Mild intermittent asthma, uncomplicated: Secondary | ICD-10-CM | POA: Diagnosis not present

## 2023-06-20 DIAGNOSIS — F29 Unspecified psychosis not due to a substance or known physiological condition: Secondary | ICD-10-CM | POA: Diagnosis not present

## 2023-06-20 DIAGNOSIS — F22 Delusional disorders: Secondary | ICD-10-CM | POA: Diagnosis not present

## 2023-06-20 DIAGNOSIS — S0990XA Unspecified injury of head, initial encounter: Secondary | ICD-10-CM | POA: Diagnosis not present

## 2023-06-21 DIAGNOSIS — J020 Streptococcal pharyngitis: Secondary | ICD-10-CM | POA: Diagnosis not present

## 2023-06-21 DIAGNOSIS — Z1152 Encounter for screening for COVID-19: Secondary | ICD-10-CM | POA: Diagnosis not present

## 2023-06-21 DIAGNOSIS — F17210 Nicotine dependence, cigarettes, uncomplicated: Secondary | ICD-10-CM | POA: Diagnosis not present

## 2023-06-21 DIAGNOSIS — F29 Unspecified psychosis not due to a substance or known physiological condition: Secondary | ICD-10-CM | POA: Diagnosis not present

## 2023-06-21 DIAGNOSIS — F200 Paranoid schizophrenia: Secondary | ICD-10-CM | POA: Diagnosis not present

## 2023-06-21 DIAGNOSIS — F1010 Alcohol abuse, uncomplicated: Secondary | ICD-10-CM | POA: Diagnosis not present

## 2023-06-21 DIAGNOSIS — Z5901 Sheltered homelessness: Secondary | ICD-10-CM | POA: Diagnosis not present

## 2023-06-21 DIAGNOSIS — J029 Acute pharyngitis, unspecified: Secondary | ICD-10-CM | POA: Diagnosis not present

## 2023-06-21 DIAGNOSIS — J4520 Mild intermittent asthma, uncomplicated: Secondary | ICD-10-CM | POA: Diagnosis not present

## 2023-06-21 DIAGNOSIS — J309 Allergic rhinitis, unspecified: Secondary | ICD-10-CM | POA: Diagnosis not present

## 2023-06-21 DIAGNOSIS — F22 Delusional disorders: Secondary | ICD-10-CM | POA: Diagnosis not present

## 2023-06-21 DIAGNOSIS — F250 Schizoaffective disorder, bipolar type: Secondary | ICD-10-CM | POA: Diagnosis not present

## 2023-06-21 DIAGNOSIS — Z87820 Personal history of traumatic brain injury: Secondary | ICD-10-CM | POA: Diagnosis not present

## 2023-06-21 DIAGNOSIS — S065X9A Traumatic subdural hemorrhage with loss of consciousness of unspecified duration, initial encounter: Secondary | ICD-10-CM | POA: Diagnosis not present

## 2023-06-21 DIAGNOSIS — Z59819 Housing instability, housed unspecified: Secondary | ICD-10-CM | POA: Diagnosis not present

## 2023-06-21 DIAGNOSIS — R519 Headache, unspecified: Secondary | ICD-10-CM | POA: Diagnosis not present

## 2023-06-30 DIAGNOSIS — J020 Streptococcal pharyngitis: Secondary | ICD-10-CM | POA: Diagnosis not present

## 2023-06-30 DIAGNOSIS — Z87820 Personal history of traumatic brain injury: Secondary | ICD-10-CM | POA: Diagnosis not present

## 2023-06-30 DIAGNOSIS — F29 Unspecified psychosis not due to a substance or known physiological condition: Secondary | ICD-10-CM | POA: Diagnosis not present

## 2023-06-30 DIAGNOSIS — F250 Schizoaffective disorder, bipolar type: Secondary | ICD-10-CM | POA: Diagnosis not present

## 2023-06-30 DIAGNOSIS — Z5901 Sheltered homelessness: Secondary | ICD-10-CM | POA: Diagnosis not present

## 2023-06-30 DIAGNOSIS — J309 Allergic rhinitis, unspecified: Secondary | ICD-10-CM | POA: Diagnosis not present

## 2023-06-30 DIAGNOSIS — F1010 Alcohol abuse, uncomplicated: Secondary | ICD-10-CM | POA: Diagnosis not present

## 2023-06-30 DIAGNOSIS — F17210 Nicotine dependence, cigarettes, uncomplicated: Secondary | ICD-10-CM | POA: Diagnosis not present

## 2023-07-01 DIAGNOSIS — Z87820 Personal history of traumatic brain injury: Secondary | ICD-10-CM | POA: Diagnosis not present

## 2023-07-01 DIAGNOSIS — J309 Allergic rhinitis, unspecified: Secondary | ICD-10-CM | POA: Diagnosis not present

## 2023-07-01 DIAGNOSIS — F29 Unspecified psychosis not due to a substance or known physiological condition: Secondary | ICD-10-CM | POA: Diagnosis not present

## 2023-07-01 DIAGNOSIS — J020 Streptococcal pharyngitis: Secondary | ICD-10-CM | POA: Diagnosis not present

## 2023-07-01 DIAGNOSIS — F1010 Alcohol abuse, uncomplicated: Secondary | ICD-10-CM | POA: Diagnosis not present

## 2023-07-01 DIAGNOSIS — F250 Schizoaffective disorder, bipolar type: Secondary | ICD-10-CM | POA: Diagnosis not present

## 2023-07-01 DIAGNOSIS — F17210 Nicotine dependence, cigarettes, uncomplicated: Secondary | ICD-10-CM | POA: Diagnosis not present

## 2023-07-01 DIAGNOSIS — Z5901 Sheltered homelessness: Secondary | ICD-10-CM | POA: Diagnosis not present

## 2023-07-09 DIAGNOSIS — F411 Generalized anxiety disorder: Secondary | ICD-10-CM | POA: Diagnosis not present

## 2023-07-09 DIAGNOSIS — Z5900 Homelessness unspecified: Secondary | ICD-10-CM | POA: Diagnosis not present

## 2023-07-09 DIAGNOSIS — F4310 Post-traumatic stress disorder, unspecified: Secondary | ICD-10-CM | POA: Diagnosis not present

## 2023-07-16 DIAGNOSIS — Z5901 Sheltered homelessness: Secondary | ICD-10-CM | POA: Diagnosis not present

## 2023-07-16 DIAGNOSIS — Z880 Allergy status to penicillin: Secondary | ICD-10-CM | POA: Diagnosis not present

## 2023-07-16 DIAGNOSIS — Z91018 Allergy to other foods: Secondary | ICD-10-CM | POA: Diagnosis not present

## 2023-07-16 DIAGNOSIS — F209 Schizophrenia, unspecified: Secondary | ICD-10-CM | POA: Diagnosis not present

## 2023-07-17 DIAGNOSIS — W19XXXA Unspecified fall, initial encounter: Secondary | ICD-10-CM | POA: Diagnosis not present

## 2023-07-17 DIAGNOSIS — Z5900 Homelessness unspecified: Secondary | ICD-10-CM | POA: Diagnosis not present

## 2023-07-17 DIAGNOSIS — S301XXA Contusion of abdominal wall, initial encounter: Secondary | ICD-10-CM | POA: Diagnosis not present

## 2023-07-17 DIAGNOSIS — Z6821 Body mass index (BMI) 21.0-21.9, adult: Secondary | ICD-10-CM | POA: Diagnosis not present

## 2023-07-17 DIAGNOSIS — F411 Generalized anxiety disorder: Secondary | ICD-10-CM | POA: Diagnosis not present

## 2023-07-17 DIAGNOSIS — F4310 Post-traumatic stress disorder, unspecified: Secondary | ICD-10-CM | POA: Diagnosis not present

## 2023-07-23 DIAGNOSIS — F411 Generalized anxiety disorder: Secondary | ICD-10-CM | POA: Diagnosis not present

## 2023-07-23 DIAGNOSIS — F4310 Post-traumatic stress disorder, unspecified: Secondary | ICD-10-CM | POA: Diagnosis not present

## 2023-07-23 DIAGNOSIS — Z5900 Homelessness unspecified: Secondary | ICD-10-CM | POA: Diagnosis not present

## 2023-07-24 DIAGNOSIS — Z6821 Body mass index (BMI) 21.0-21.9, adult: Secondary | ICD-10-CM | POA: Diagnosis not present

## 2023-07-24 DIAGNOSIS — K047 Periapical abscess without sinus: Secondary | ICD-10-CM | POA: Diagnosis not present

## 2023-07-24 DIAGNOSIS — K0889 Other specified disorders of teeth and supporting structures: Secondary | ICD-10-CM | POA: Diagnosis not present

## 2023-07-30 DIAGNOSIS — F4310 Post-traumatic stress disorder, unspecified: Secondary | ICD-10-CM | POA: Diagnosis not present

## 2023-07-30 DIAGNOSIS — Z5900 Homelessness unspecified: Secondary | ICD-10-CM | POA: Diagnosis not present

## 2023-07-30 DIAGNOSIS — F411 Generalized anxiety disorder: Secondary | ICD-10-CM | POA: Diagnosis not present

## 2023-07-31 DIAGNOSIS — R300 Dysuria: Secondary | ICD-10-CM | POA: Diagnosis not present

## 2023-07-31 DIAGNOSIS — Z6821 Body mass index (BMI) 21.0-21.9, adult: Secondary | ICD-10-CM | POA: Diagnosis not present

## 2023-07-31 DIAGNOSIS — N898 Other specified noninflammatory disorders of vagina: Secondary | ICD-10-CM | POA: Diagnosis not present

## 2023-08-01 DIAGNOSIS — R300 Dysuria: Secondary | ICD-10-CM | POA: Diagnosis not present

## 2023-08-01 DIAGNOSIS — Z6821 Body mass index (BMI) 21.0-21.9, adult: Secondary | ICD-10-CM | POA: Diagnosis not present

## 2023-08-03 DIAGNOSIS — Z23 Encounter for immunization: Secondary | ICD-10-CM | POA: Diagnosis not present

## 2023-08-07 DIAGNOSIS — F250 Schizoaffective disorder, bipolar type: Secondary | ICD-10-CM | POA: Diagnosis not present

## 2023-08-14 DIAGNOSIS — F250 Schizoaffective disorder, bipolar type: Secondary | ICD-10-CM | POA: Diagnosis not present

## 2023-08-15 DIAGNOSIS — Z8679 Personal history of other diseases of the circulatory system: Secondary | ICD-10-CM | POA: Diagnosis not present

## 2023-08-21 DIAGNOSIS — F250 Schizoaffective disorder, bipolar type: Secondary | ICD-10-CM | POA: Diagnosis not present

## 2023-08-27 DIAGNOSIS — Z20822 Contact with and (suspected) exposure to covid-19: Secondary | ICD-10-CM | POA: Diagnosis not present

## 2023-08-28 DIAGNOSIS — L84 Corns and callosities: Secondary | ICD-10-CM | POA: Diagnosis not present

## 2023-08-28 DIAGNOSIS — I739 Peripheral vascular disease, unspecified: Secondary | ICD-10-CM | POA: Diagnosis not present

## 2023-08-28 DIAGNOSIS — B351 Tinea unguium: Secondary | ICD-10-CM | POA: Diagnosis not present

## 2023-08-28 DIAGNOSIS — M79672 Pain in left foot: Secondary | ICD-10-CM | POA: Diagnosis not present

## 2023-08-28 DIAGNOSIS — M79671 Pain in right foot: Secondary | ICD-10-CM | POA: Diagnosis not present

## 2023-08-28 DIAGNOSIS — M79674 Pain in right toe(s): Secondary | ICD-10-CM | POA: Diagnosis not present

## 2023-08-28 DIAGNOSIS — M79675 Pain in left toe(s): Secondary | ICD-10-CM | POA: Diagnosis not present

## 2023-08-28 DIAGNOSIS — F250 Schizoaffective disorder, bipolar type: Secondary | ICD-10-CM | POA: Diagnosis not present

## 2023-09-04 DIAGNOSIS — F250 Schizoaffective disorder, bipolar type: Secondary | ICD-10-CM | POA: Diagnosis not present

## 2023-09-05 DIAGNOSIS — F250 Schizoaffective disorder, bipolar type: Secondary | ICD-10-CM | POA: Diagnosis not present

## 2023-09-11 DIAGNOSIS — F250 Schizoaffective disorder, bipolar type: Secondary | ICD-10-CM | POA: Diagnosis not present

## 2023-09-17 DIAGNOSIS — Z20822 Contact with and (suspected) exposure to covid-19: Secondary | ICD-10-CM | POA: Diagnosis not present

## 2023-09-24 DIAGNOSIS — Z20822 Contact with and (suspected) exposure to covid-19: Secondary | ICD-10-CM | POA: Diagnosis not present

## 2023-09-28 DIAGNOSIS — F250 Schizoaffective disorder, bipolar type: Secondary | ICD-10-CM | POA: Diagnosis not present

## 2023-10-02 DIAGNOSIS — F250 Schizoaffective disorder, bipolar type: Secondary | ICD-10-CM | POA: Diagnosis not present

## 2023-10-05 DIAGNOSIS — Z20822 Contact with and (suspected) exposure to covid-19: Secondary | ICD-10-CM | POA: Diagnosis not present

## 2023-10-09 DIAGNOSIS — F250 Schizoaffective disorder, bipolar type: Secondary | ICD-10-CM | POA: Diagnosis not present

## 2023-10-12 DIAGNOSIS — Z20822 Contact with and (suspected) exposure to covid-19: Secondary | ICD-10-CM | POA: Diagnosis not present

## 2023-10-17 DIAGNOSIS — R7301 Impaired fasting glucose: Secondary | ICD-10-CM | POA: Diagnosis not present

## 2023-10-17 DIAGNOSIS — J302 Other seasonal allergic rhinitis: Secondary | ICD-10-CM | POA: Diagnosis not present

## 2023-10-17 DIAGNOSIS — Z720 Tobacco use: Secondary | ICD-10-CM | POA: Diagnosis not present

## 2023-10-17 DIAGNOSIS — E559 Vitamin D deficiency, unspecified: Secondary | ICD-10-CM | POA: Diagnosis not present

## 2023-10-17 DIAGNOSIS — I10 Essential (primary) hypertension: Secondary | ICD-10-CM | POA: Diagnosis not present

## 2023-10-17 DIAGNOSIS — M1990 Unspecified osteoarthritis, unspecified site: Secondary | ICD-10-CM | POA: Diagnosis not present

## 2023-10-17 DIAGNOSIS — F259 Schizoaffective disorder, unspecified: Secondary | ICD-10-CM | POA: Diagnosis not present

## 2023-10-17 DIAGNOSIS — K219 Gastro-esophageal reflux disease without esophagitis: Secondary | ICD-10-CM | POA: Diagnosis not present

## 2023-10-17 DIAGNOSIS — F319 Bipolar disorder, unspecified: Secondary | ICD-10-CM | POA: Diagnosis not present

## 2023-10-19 DIAGNOSIS — Z20822 Contact with and (suspected) exposure to covid-19: Secondary | ICD-10-CM | POA: Diagnosis not present

## 2023-10-30 DIAGNOSIS — M79674 Pain in right toe(s): Secondary | ICD-10-CM | POA: Diagnosis not present

## 2023-10-30 DIAGNOSIS — M79672 Pain in left foot: Secondary | ICD-10-CM | POA: Diagnosis not present

## 2023-10-30 DIAGNOSIS — M79671 Pain in right foot: Secondary | ICD-10-CM | POA: Diagnosis not present

## 2023-10-30 DIAGNOSIS — M79675 Pain in left toe(s): Secondary | ICD-10-CM | POA: Diagnosis not present

## 2023-10-30 DIAGNOSIS — B351 Tinea unguium: Secondary | ICD-10-CM | POA: Diagnosis not present

## 2023-10-30 DIAGNOSIS — F250 Schizoaffective disorder, bipolar type: Secondary | ICD-10-CM | POA: Diagnosis not present

## 2023-10-30 DIAGNOSIS — L84 Corns and callosities: Secondary | ICD-10-CM | POA: Diagnosis not present

## 2023-10-30 DIAGNOSIS — I739 Peripheral vascular disease, unspecified: Secondary | ICD-10-CM | POA: Diagnosis not present

## 2023-11-02 DIAGNOSIS — Z20822 Contact with and (suspected) exposure to covid-19: Secondary | ICD-10-CM | POA: Diagnosis not present

## 2023-11-15 DIAGNOSIS — R739 Hyperglycemia, unspecified: Secondary | ICD-10-CM | POA: Diagnosis not present

## 2023-11-15 DIAGNOSIS — Z13820 Encounter for screening for osteoporosis: Secondary | ICD-10-CM | POA: Diagnosis not present

## 2023-11-15 DIAGNOSIS — J4540 Moderate persistent asthma, uncomplicated: Secondary | ICD-10-CM | POA: Diagnosis not present

## 2023-11-15 DIAGNOSIS — Z889 Allergy status to unspecified drugs, medicaments and biological substances status: Secondary | ICD-10-CM | POA: Diagnosis not present

## 2023-11-15 DIAGNOSIS — Z872 Personal history of diseases of the skin and subcutaneous tissue: Secondary | ICD-10-CM | POA: Diagnosis not present

## 2023-11-15 DIAGNOSIS — J309 Allergic rhinitis, unspecified: Secondary | ICD-10-CM | POA: Diagnosis not present

## 2023-11-15 DIAGNOSIS — E559 Vitamin D deficiency, unspecified: Secondary | ICD-10-CM | POA: Diagnosis not present

## 2023-11-15 DIAGNOSIS — Z1330 Encounter for screening examination for mental health and behavioral disorders, unspecified: Secondary | ICD-10-CM | POA: Diagnosis not present

## 2023-11-15 DIAGNOSIS — I10 Essential (primary) hypertension: Secondary | ICD-10-CM | POA: Diagnosis not present

## 2023-11-20 DIAGNOSIS — F250 Schizoaffective disorder, bipolar type: Secondary | ICD-10-CM | POA: Diagnosis not present

## 2023-11-20 DIAGNOSIS — H353132 Nonexudative age-related macular degeneration, bilateral, intermediate dry stage: Secondary | ICD-10-CM | POA: Diagnosis not present

## 2023-11-20 DIAGNOSIS — H2513 Age-related nuclear cataract, bilateral: Secondary | ICD-10-CM | POA: Diagnosis not present

## 2023-11-30 DIAGNOSIS — R739 Hyperglycemia, unspecified: Secondary | ICD-10-CM | POA: Diagnosis not present

## 2023-11-30 DIAGNOSIS — E559 Vitamin D deficiency, unspecified: Secondary | ICD-10-CM | POA: Diagnosis not present

## 2023-11-30 DIAGNOSIS — I10 Essential (primary) hypertension: Secondary | ICD-10-CM | POA: Diagnosis not present

## 2023-11-30 DIAGNOSIS — Z20822 Contact with and (suspected) exposure to covid-19: Secondary | ICD-10-CM | POA: Diagnosis not present

## 2023-11-30 DIAGNOSIS — N390 Urinary tract infection, site not specified: Secondary | ICD-10-CM | POA: Diagnosis not present

## 2023-12-07 DIAGNOSIS — R7303 Prediabetes: Secondary | ICD-10-CM | POA: Diagnosis not present

## 2023-12-07 DIAGNOSIS — I10 Essential (primary) hypertension: Secondary | ICD-10-CM | POA: Diagnosis not present

## 2023-12-07 DIAGNOSIS — E559 Vitamin D deficiency, unspecified: Secondary | ICD-10-CM | POA: Diagnosis not present

## 2023-12-07 DIAGNOSIS — J45909 Unspecified asthma, uncomplicated: Secondary | ICD-10-CM | POA: Diagnosis not present

## 2023-12-19 DIAGNOSIS — F259 Schizoaffective disorder, unspecified: Secondary | ICD-10-CM | POA: Diagnosis not present

## 2023-12-19 DIAGNOSIS — I10 Essential (primary) hypertension: Secondary | ICD-10-CM | POA: Diagnosis not present

## 2023-12-19 DIAGNOSIS — Z720 Tobacco use: Secondary | ICD-10-CM | POA: Diagnosis not present

## 2023-12-19 DIAGNOSIS — K219 Gastro-esophageal reflux disease without esophagitis: Secondary | ICD-10-CM | POA: Diagnosis not present

## 2023-12-19 DIAGNOSIS — F319 Bipolar disorder, unspecified: Secondary | ICD-10-CM | POA: Diagnosis not present

## 2023-12-19 DIAGNOSIS — E559 Vitamin D deficiency, unspecified: Secondary | ICD-10-CM | POA: Diagnosis not present

## 2023-12-19 DIAGNOSIS — R7301 Impaired fasting glucose: Secondary | ICD-10-CM | POA: Diagnosis not present

## 2023-12-19 DIAGNOSIS — M1990 Unspecified osteoarthritis, unspecified site: Secondary | ICD-10-CM | POA: Diagnosis not present

## 2023-12-19 DIAGNOSIS — J302 Other seasonal allergic rhinitis: Secondary | ICD-10-CM | POA: Diagnosis not present

## 2023-12-21 DIAGNOSIS — M810 Age-related osteoporosis without current pathological fracture: Secondary | ICD-10-CM | POA: Diagnosis not present

## 2023-12-21 DIAGNOSIS — Z20822 Contact with and (suspected) exposure to covid-19: Secondary | ICD-10-CM | POA: Diagnosis not present

## 2024-01-04 DIAGNOSIS — Z20822 Contact with and (suspected) exposure to covid-19: Secondary | ICD-10-CM | POA: Diagnosis not present

## 2024-01-08 DIAGNOSIS — M79675 Pain in left toe(s): Secondary | ICD-10-CM | POA: Diagnosis not present

## 2024-01-08 DIAGNOSIS — I739 Peripheral vascular disease, unspecified: Secondary | ICD-10-CM | POA: Diagnosis not present

## 2024-01-08 DIAGNOSIS — M79671 Pain in right foot: Secondary | ICD-10-CM | POA: Diagnosis not present

## 2024-01-08 DIAGNOSIS — L84 Corns and callosities: Secondary | ICD-10-CM | POA: Diagnosis not present

## 2024-01-08 DIAGNOSIS — M79674 Pain in right toe(s): Secondary | ICD-10-CM | POA: Diagnosis not present

## 2024-01-08 DIAGNOSIS — M79672 Pain in left foot: Secondary | ICD-10-CM | POA: Diagnosis not present

## 2024-01-08 DIAGNOSIS — B351 Tinea unguium: Secondary | ICD-10-CM | POA: Diagnosis not present

## 2024-01-11 DIAGNOSIS — Z20822 Contact with and (suspected) exposure to covid-19: Secondary | ICD-10-CM | POA: Diagnosis not present

## 2024-01-28 DIAGNOSIS — J45909 Unspecified asthma, uncomplicated: Secondary | ICD-10-CM | POA: Diagnosis not present

## 2024-01-28 DIAGNOSIS — I10 Essential (primary) hypertension: Secondary | ICD-10-CM | POA: Diagnosis not present

## 2024-01-28 DIAGNOSIS — E559 Vitamin D deficiency, unspecified: Secondary | ICD-10-CM | POA: Diagnosis not present

## 2024-01-28 DIAGNOSIS — R7303 Prediabetes: Secondary | ICD-10-CM | POA: Diagnosis not present

## 2024-02-01 DIAGNOSIS — Z20822 Contact with and (suspected) exposure to covid-19: Secondary | ICD-10-CM | POA: Diagnosis not present

## 2024-02-05 DIAGNOSIS — F250 Schizoaffective disorder, bipolar type: Secondary | ICD-10-CM | POA: Diagnosis not present

## 2024-02-08 DIAGNOSIS — Z20822 Contact with and (suspected) exposure to covid-19: Secondary | ICD-10-CM | POA: Diagnosis not present

## 2024-02-15 DIAGNOSIS — F22 Delusional disorders: Secondary | ICD-10-CM | POA: Diagnosis not present

## 2024-02-15 DIAGNOSIS — T7840XD Allergy, unspecified, subsequent encounter: Secondary | ICD-10-CM | POA: Diagnosis not present

## 2024-02-15 DIAGNOSIS — N3000 Acute cystitis without hematuria: Secondary | ICD-10-CM | POA: Diagnosis not present

## 2024-02-15 DIAGNOSIS — N390 Urinary tract infection, site not specified: Secondary | ICD-10-CM | POA: Diagnosis not present

## 2024-02-15 DIAGNOSIS — M549 Dorsalgia, unspecified: Secondary | ICD-10-CM | POA: Diagnosis not present

## 2024-02-15 DIAGNOSIS — I10 Essential (primary) hypertension: Secondary | ICD-10-CM | POA: Diagnosis not present

## 2024-02-15 DIAGNOSIS — G8929 Other chronic pain: Secondary | ICD-10-CM | POA: Diagnosis not present

## 2024-02-15 DIAGNOSIS — F17200 Nicotine dependence, unspecified, uncomplicated: Secondary | ICD-10-CM | POA: Diagnosis not present

## 2024-02-15 DIAGNOSIS — F250 Schizoaffective disorder, bipolar type: Secondary | ICD-10-CM | POA: Diagnosis not present

## 2024-02-15 DIAGNOSIS — R451 Restlessness and agitation: Secondary | ICD-10-CM | POA: Diagnosis not present

## 2024-02-15 DIAGNOSIS — E559 Vitamin D deficiency, unspecified: Secondary | ICD-10-CM | POA: Diagnosis not present

## 2024-02-15 DIAGNOSIS — J45909 Unspecified asthma, uncomplicated: Secondary | ICD-10-CM | POA: Diagnosis not present

## 2024-02-15 DIAGNOSIS — R7303 Prediabetes: Secondary | ICD-10-CM | POA: Diagnosis not present

## 2024-02-15 DIAGNOSIS — R4689 Other symptoms and signs involving appearance and behavior: Secondary | ICD-10-CM | POA: Diagnosis not present

## 2024-02-15 DIAGNOSIS — F259 Schizoaffective disorder, unspecified: Secondary | ICD-10-CM | POA: Diagnosis not present

## 2024-02-15 DIAGNOSIS — J309 Allergic rhinitis, unspecified: Secondary | ICD-10-CM | POA: Diagnosis not present

## 2024-02-15 DIAGNOSIS — F419 Anxiety disorder, unspecified: Secondary | ICD-10-CM | POA: Diagnosis not present

## 2024-02-16 DIAGNOSIS — R4689 Other symptoms and signs involving appearance and behavior: Secondary | ICD-10-CM | POA: Diagnosis not present

## 2024-02-16 DIAGNOSIS — R451 Restlessness and agitation: Secondary | ICD-10-CM | POA: Diagnosis not present

## 2024-02-17 DIAGNOSIS — R451 Restlessness and agitation: Secondary | ICD-10-CM | POA: Diagnosis not present

## 2024-02-17 DIAGNOSIS — F250 Schizoaffective disorder, bipolar type: Secondary | ICD-10-CM | POA: Diagnosis not present

## 2024-02-18 DIAGNOSIS — F250 Schizoaffective disorder, bipolar type: Secondary | ICD-10-CM | POA: Diagnosis not present

## 2024-02-19 DIAGNOSIS — F250 Schizoaffective disorder, bipolar type: Secondary | ICD-10-CM | POA: Diagnosis not present

## 2024-02-20 DIAGNOSIS — F250 Schizoaffective disorder, bipolar type: Secondary | ICD-10-CM | POA: Diagnosis not present

## 2024-02-21 DIAGNOSIS — F250 Schizoaffective disorder, bipolar type: Secondary | ICD-10-CM | POA: Diagnosis not present

## 2024-02-22 DIAGNOSIS — F250 Schizoaffective disorder, bipolar type: Secondary | ICD-10-CM | POA: Diagnosis not present

## 2024-02-23 DIAGNOSIS — F250 Schizoaffective disorder, bipolar type: Secondary | ICD-10-CM | POA: Diagnosis not present

## 2024-02-24 DIAGNOSIS — F250 Schizoaffective disorder, bipolar type: Secondary | ICD-10-CM | POA: Diagnosis not present

## 2024-02-25 DIAGNOSIS — F250 Schizoaffective disorder, bipolar type: Secondary | ICD-10-CM | POA: Diagnosis not present

## 2024-02-26 DIAGNOSIS — F250 Schizoaffective disorder, bipolar type: Secondary | ICD-10-CM | POA: Diagnosis not present

## 2024-02-26 DIAGNOSIS — I10 Essential (primary) hypertension: Secondary | ICD-10-CM | POA: Diagnosis not present

## 2024-02-26 DIAGNOSIS — E559 Vitamin D deficiency, unspecified: Secondary | ICD-10-CM | POA: Diagnosis not present

## 2024-02-26 DIAGNOSIS — J45909 Unspecified asthma, uncomplicated: Secondary | ICD-10-CM | POA: Diagnosis not present

## 2024-02-26 DIAGNOSIS — R7303 Prediabetes: Secondary | ICD-10-CM | POA: Diagnosis not present

## 2024-02-27 DIAGNOSIS — F250 Schizoaffective disorder, bipolar type: Secondary | ICD-10-CM | POA: Diagnosis not present

## 2024-02-28 DIAGNOSIS — F250 Schizoaffective disorder, bipolar type: Secondary | ICD-10-CM | POA: Diagnosis not present

## 2024-03-02 DIAGNOSIS — F250 Schizoaffective disorder, bipolar type: Secondary | ICD-10-CM | POA: Diagnosis not present

## 2024-03-03 DIAGNOSIS — F250 Schizoaffective disorder, bipolar type: Secondary | ICD-10-CM | POA: Diagnosis not present

## 2024-03-04 DIAGNOSIS — F250 Schizoaffective disorder, bipolar type: Secondary | ICD-10-CM | POA: Diagnosis not present

## 2024-03-05 DIAGNOSIS — F250 Schizoaffective disorder, bipolar type: Secondary | ICD-10-CM | POA: Diagnosis not present

## 2024-03-06 DIAGNOSIS — F250 Schizoaffective disorder, bipolar type: Secondary | ICD-10-CM | POA: Diagnosis not present

## 2024-03-07 DIAGNOSIS — F250 Schizoaffective disorder, bipolar type: Secondary | ICD-10-CM | POA: Diagnosis not present

## 2024-03-08 DIAGNOSIS — F250 Schizoaffective disorder, bipolar type: Secondary | ICD-10-CM | POA: Diagnosis not present

## 2024-03-09 DIAGNOSIS — F250 Schizoaffective disorder, bipolar type: Secondary | ICD-10-CM | POA: Diagnosis not present

## 2024-03-14 DIAGNOSIS — I10 Essential (primary) hypertension: Secondary | ICD-10-CM | POA: Diagnosis not present

## 2024-03-14 DIAGNOSIS — G8929 Other chronic pain: Secondary | ICD-10-CM | POA: Diagnosis not present

## 2024-03-14 DIAGNOSIS — F17200 Nicotine dependence, unspecified, uncomplicated: Secondary | ICD-10-CM | POA: Diagnosis not present

## 2024-03-14 DIAGNOSIS — F259 Schizoaffective disorder, unspecified: Secondary | ICD-10-CM | POA: Diagnosis not present

## 2024-03-14 DIAGNOSIS — T7840XD Allergy, unspecified, subsequent encounter: Secondary | ICD-10-CM | POA: Diagnosis not present

## 2024-03-14 DIAGNOSIS — N3000 Acute cystitis without hematuria: Secondary | ICD-10-CM | POA: Diagnosis not present

## 2024-03-15 DIAGNOSIS — T7840XD Allergy, unspecified, subsequent encounter: Secondary | ICD-10-CM | POA: Diagnosis not present

## 2024-03-15 DIAGNOSIS — N3000 Acute cystitis without hematuria: Secondary | ICD-10-CM | POA: Diagnosis not present

## 2024-03-15 DIAGNOSIS — F259 Schizoaffective disorder, unspecified: Secondary | ICD-10-CM | POA: Diagnosis not present

## 2024-03-15 DIAGNOSIS — F17200 Nicotine dependence, unspecified, uncomplicated: Secondary | ICD-10-CM | POA: Diagnosis not present

## 2024-03-15 DIAGNOSIS — I10 Essential (primary) hypertension: Secondary | ICD-10-CM | POA: Diagnosis not present

## 2024-03-15 DIAGNOSIS — G8929 Other chronic pain: Secondary | ICD-10-CM | POA: Diagnosis not present

## 2024-03-16 DIAGNOSIS — G8929 Other chronic pain: Secondary | ICD-10-CM | POA: Diagnosis not present

## 2024-03-16 DIAGNOSIS — F259 Schizoaffective disorder, unspecified: Secondary | ICD-10-CM | POA: Diagnosis not present

## 2024-03-16 DIAGNOSIS — I10 Essential (primary) hypertension: Secondary | ICD-10-CM | POA: Diagnosis not present

## 2024-03-16 DIAGNOSIS — T7840XD Allergy, unspecified, subsequent encounter: Secondary | ICD-10-CM | POA: Diagnosis not present

## 2024-03-16 DIAGNOSIS — N3000 Acute cystitis without hematuria: Secondary | ICD-10-CM | POA: Diagnosis not present

## 2024-03-16 DIAGNOSIS — F17200 Nicotine dependence, unspecified, uncomplicated: Secondary | ICD-10-CM | POA: Diagnosis not present

## 2024-03-23 DIAGNOSIS — F250 Schizoaffective disorder, bipolar type: Secondary | ICD-10-CM | POA: Diagnosis not present

## 2024-03-24 DIAGNOSIS — F250 Schizoaffective disorder, bipolar type: Secondary | ICD-10-CM | POA: Diagnosis not present

## 2024-03-25 DIAGNOSIS — F250 Schizoaffective disorder, bipolar type: Secondary | ICD-10-CM | POA: Diagnosis not present

## 2024-03-26 DIAGNOSIS — R7303 Prediabetes: Secondary | ICD-10-CM | POA: Diagnosis not present

## 2024-03-26 DIAGNOSIS — I10 Essential (primary) hypertension: Secondary | ICD-10-CM | POA: Diagnosis not present

## 2024-03-26 DIAGNOSIS — F250 Schizoaffective disorder, bipolar type: Secondary | ICD-10-CM | POA: Diagnosis not present

## 2024-03-26 DIAGNOSIS — E559 Vitamin D deficiency, unspecified: Secondary | ICD-10-CM | POA: Diagnosis not present

## 2024-03-26 DIAGNOSIS — J45909 Unspecified asthma, uncomplicated: Secondary | ICD-10-CM | POA: Diagnosis not present

## 2024-03-27 DIAGNOSIS — F250 Schizoaffective disorder, bipolar type: Secondary | ICD-10-CM | POA: Diagnosis not present

## 2024-03-28 DIAGNOSIS — F250 Schizoaffective disorder, bipolar type: Secondary | ICD-10-CM | POA: Diagnosis not present

## 2024-03-29 DIAGNOSIS — F250 Schizoaffective disorder, bipolar type: Secondary | ICD-10-CM | POA: Diagnosis not present

## 2024-03-30 DIAGNOSIS — F250 Schizoaffective disorder, bipolar type: Secondary | ICD-10-CM | POA: Diagnosis not present

## 2024-03-31 DIAGNOSIS — F250 Schizoaffective disorder, bipolar type: Secondary | ICD-10-CM | POA: Diagnosis not present

## 2024-04-01 DIAGNOSIS — F250 Schizoaffective disorder, bipolar type: Secondary | ICD-10-CM | POA: Diagnosis not present

## 2024-04-02 DIAGNOSIS — I10 Essential (primary) hypertension: Secondary | ICD-10-CM | POA: Diagnosis not present

## 2024-04-02 DIAGNOSIS — E559 Vitamin D deficiency, unspecified: Secondary | ICD-10-CM | POA: Diagnosis not present

## 2024-04-02 DIAGNOSIS — F319 Bipolar disorder, unspecified: Secondary | ICD-10-CM | POA: Diagnosis not present

## 2024-04-02 DIAGNOSIS — K219 Gastro-esophageal reflux disease without esophagitis: Secondary | ICD-10-CM | POA: Diagnosis not present

## 2024-04-02 DIAGNOSIS — F259 Schizoaffective disorder, unspecified: Secondary | ICD-10-CM | POA: Diagnosis not present

## 2024-04-02 DIAGNOSIS — Z720 Tobacco use: Secondary | ICD-10-CM | POA: Diagnosis not present

## 2024-04-02 DIAGNOSIS — J302 Other seasonal allergic rhinitis: Secondary | ICD-10-CM | POA: Diagnosis not present

## 2024-04-02 DIAGNOSIS — M1990 Unspecified osteoarthritis, unspecified site: Secondary | ICD-10-CM | POA: Diagnosis not present

## 2024-04-02 DIAGNOSIS — R7301 Impaired fasting glucose: Secondary | ICD-10-CM | POA: Diagnosis not present

## 2024-04-09 DIAGNOSIS — F250 Schizoaffective disorder, bipolar type: Secondary | ICD-10-CM | POA: Diagnosis not present

## 2024-04-10 DIAGNOSIS — Z20822 Contact with and (suspected) exposure to covid-19: Secondary | ICD-10-CM | POA: Diagnosis not present

## 2024-04-17 DIAGNOSIS — M19072 Primary osteoarthritis, left ankle and foot: Secondary | ICD-10-CM | POA: Diagnosis not present

## 2024-04-17 DIAGNOSIS — M79674 Pain in right toe(s): Secondary | ICD-10-CM | POA: Diagnosis not present

## 2024-04-17 DIAGNOSIS — M79675 Pain in left toe(s): Secondary | ICD-10-CM | POA: Diagnosis not present

## 2024-04-17 DIAGNOSIS — R2681 Unsteadiness on feet: Secondary | ICD-10-CM | POA: Diagnosis not present

## 2024-04-17 DIAGNOSIS — L851 Acquired keratosis [keratoderma] palmaris et plantaris: Secondary | ICD-10-CM | POA: Diagnosis not present

## 2024-04-17 DIAGNOSIS — L603 Nail dystrophy: Secondary | ICD-10-CM | POA: Diagnosis not present

## 2024-04-17 DIAGNOSIS — B351 Tinea unguium: Secondary | ICD-10-CM | POA: Diagnosis not present

## 2024-04-22 DIAGNOSIS — F250 Schizoaffective disorder, bipolar type: Secondary | ICD-10-CM | POA: Diagnosis not present

## 2024-04-22 DIAGNOSIS — Z20822 Contact with and (suspected) exposure to covid-19: Secondary | ICD-10-CM | POA: Diagnosis not present

## 2024-04-28 DIAGNOSIS — R7303 Prediabetes: Secondary | ICD-10-CM | POA: Diagnosis not present

## 2024-04-28 DIAGNOSIS — J45909 Unspecified asthma, uncomplicated: Secondary | ICD-10-CM | POA: Diagnosis not present

## 2024-04-28 DIAGNOSIS — Z20822 Contact with and (suspected) exposure to covid-19: Secondary | ICD-10-CM | POA: Diagnosis not present

## 2024-04-28 DIAGNOSIS — I10 Essential (primary) hypertension: Secondary | ICD-10-CM | POA: Diagnosis not present

## 2024-04-28 DIAGNOSIS — E559 Vitamin D deficiency, unspecified: Secondary | ICD-10-CM | POA: Diagnosis not present

## 2024-05-06 DIAGNOSIS — Z20822 Contact with and (suspected) exposure to covid-19: Secondary | ICD-10-CM | POA: Diagnosis not present

## 2024-05-07 DIAGNOSIS — F250 Schizoaffective disorder, bipolar type: Secondary | ICD-10-CM | POA: Diagnosis not present

## 2024-05-12 DIAGNOSIS — Z20822 Contact with and (suspected) exposure to covid-19: Secondary | ICD-10-CM | POA: Diagnosis not present

## 2024-05-20 DIAGNOSIS — Z20822 Contact with and (suspected) exposure to covid-19: Secondary | ICD-10-CM | POA: Diagnosis not present

## 2024-05-26 DIAGNOSIS — I10 Essential (primary) hypertension: Secondary | ICD-10-CM | POA: Diagnosis not present

## 2024-05-26 DIAGNOSIS — J45909 Unspecified asthma, uncomplicated: Secondary | ICD-10-CM | POA: Diagnosis not present

## 2024-05-26 DIAGNOSIS — Z20822 Contact with and (suspected) exposure to covid-19: Secondary | ICD-10-CM | POA: Diagnosis not present

## 2024-05-26 DIAGNOSIS — R7303 Prediabetes: Secondary | ICD-10-CM | POA: Diagnosis not present

## 2024-05-26 DIAGNOSIS — E559 Vitamin D deficiency, unspecified: Secondary | ICD-10-CM | POA: Diagnosis not present
